# Patient Record
Sex: Female | Born: 1990
Health system: Southern US, Community
[De-identification: ages and names within clinical notes are randomized; demographics above are authoritative.]

## PROBLEM LIST (undated history)

## (undated) DIAGNOSIS — D649 Anemia, unspecified: Secondary | ICD-10-CM

## (undated) DIAGNOSIS — E559 Vitamin D deficiency, unspecified: Secondary | ICD-10-CM

## (undated) DIAGNOSIS — IMO0002 Reserved for concepts with insufficient information to code with codable children: Secondary | ICD-10-CM

## (undated) HISTORY — DX: Reserved for concepts with insufficient information to code with codable children: IMO0002

## (undated) HISTORY — DX: Anemia, unspecified: D64.9

## (undated) HISTORY — PX: WISDOM TOOTH EXTRACTION: SHX21

## (undated) HISTORY — DX: Vitamin D deficiency, unspecified: E55.9

---
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
## 2009-07-31 ENCOUNTER — Emergency Department (HOSPITAL_COMMUNITY): Admission: EM | Admit: 2009-07-31 | Discharge: 2009-08-01 | Payer: Self-pay | Admitting: Emergency Medicine

## 2010-10-02 LAB — COMPREHENSIVE METABOLIC PANEL
ALT: 15 U/L (ref 0–35)
AST: 29 U/L (ref 0–37)
Albumin: 4.1 g/dL (ref 3.5–5.2)
Alkaline Phosphatase: 53 U/L (ref 39–117)
BUN: 9 mg/dL (ref 6–23)
CO2: 27 mEq/L (ref 19–32)
Calcium: 8.9 mg/dL (ref 8.4–10.5)
Chloride: 100 mEq/L (ref 96–112)
Creatinine, Ser: 0.62 mg/dL (ref 0.4–1.2)
GFR calc Af Amer: >60 mL/min (ref >60)
GFR calc non Af Amer: >60 mL/min (ref >60)
Glucose, Bld: 101 mg/dL — ABNORMAL HIGH (ref 70–99)
Potassium: 3.1 mEq/L — ABNORMAL LOW (ref 3.5–5.1)
Sodium: 134 mEq/L — ABNORMAL LOW (ref 135–145)
Total Bilirubin: 0.4 mg/dL (ref 0.3–1.2)
Total Protein: 7.7 g/dL (ref 6.0–8.3)

## 2010-10-02 LAB — CBC
HCT: 38.1 % (ref 36.0–46.0)
Hemoglobin: 13.4 g/dL (ref 12.0–15.0)
MCHC: 35.1 g/dL (ref 30.0–36.0)
MCV: 91.8 fL (ref 78.0–100.0)
Platelets: 197 10*3/uL (ref 150–400)
RBC: 4.15 MIL/uL (ref 3.87–5.11)
RDW: 12.4 % (ref 11.5–15.5)
WBC: 8.3 10*3/uL (ref 4.0–10.5)

## 2010-10-02 LAB — DIFFERENTIAL
Basophils Absolute: 0 10*3/uL (ref 0.0–0.1)
Basophils Relative: 0 % (ref 0–1)
Eosinophils Absolute: 0 10*3/uL (ref 0.0–0.7)
Eosinophils Relative: 1 % (ref 0–5)
Lymphocytes Relative: 16 % (ref 12–46)
Lymphs Abs: 1.4 10*3/uL (ref 0.7–4.0)
Monocytes Absolute: 0.6 10*3/uL (ref 0.1–1.0)
Monocytes Relative: 7 % (ref 3–12)
Neutro Abs: 6.3 10*3/uL (ref 1.7–7.7)
Neutrophils Relative %: 76 % (ref 43–77)

## 2010-10-02 LAB — URINALYSIS, ROUTINE W REFLEX MICROSCOPIC
Bilirubin Urine: NEGATIVE
Glucose, UA: NEGATIVE mg/dL
Hgb urine dipstick: NEGATIVE
Ketones, ur: NEGATIVE mg/dL
Nitrite: NEGATIVE
Protein, ur: NEGATIVE mg/dL
Specific Gravity, Urine: 1.01 (ref 1.005–1.030)
Urobilinogen, UA: 0.2 mg/dL (ref 0.0–1.0)
pH: 7 (ref 5.0–8.0)

## 2010-10-02 LAB — POCT PREGNANCY, URINE: Preg Test, Ur: NEGATIVE

## 2010-10-02 LAB — URINE MICROSCOPIC-ADD ON

## 2010-10-02 LAB — LIPASE, BLOOD: Lipase: 37 U/L (ref 11–59)

## 2011-07-18 LAB — HM PAP SMEAR: HM Pap smear: ABNORMAL

## 2012-10-28 ENCOUNTER — Other Ambulatory Visit: Payer: Self-pay | Admitting: Orthopedic Surgery

## 2012-11-18 ENCOUNTER — Encounter (HOSPITAL_COMMUNITY): Payer: Self-pay

## 2012-11-21 ENCOUNTER — Encounter (HOSPITAL_COMMUNITY)
Admission: RE | Admit: 2012-11-21 | Discharge: 2012-11-21 | Disposition: A | Discharge status: Home / Self Care | Payer: BC Managed Care – PPO | Source: Ambulatory Visit | Attending: Orthopedic Surgery | Admitting: Orthopedic Surgery

## 2012-11-21 ENCOUNTER — Ambulatory Visit (HOSPITAL_COMMUNITY)
Admission: RE | Admit: 2012-11-21 | Discharge: 2012-11-21 | Disposition: A | Discharge status: Home / Self Care | Payer: BC Managed Care – PPO | Source: Ambulatory Visit | Attending: Orthopedic Surgery | Admitting: Orthopedic Surgery

## 2012-11-21 ENCOUNTER — Encounter (HOSPITAL_COMMUNITY): Payer: Self-pay

## 2012-11-21 DIAGNOSIS — Z01818 Encounter for other preprocedural examination: Secondary | ICD-10-CM | POA: Insufficient documentation

## 2012-11-21 DIAGNOSIS — Z01812 Encounter for preprocedural laboratory examination: Secondary | ICD-10-CM | POA: Insufficient documentation

## 2012-11-21 DIAGNOSIS — Z01810 Encounter for preprocedural cardiovascular examination: Secondary | ICD-10-CM | POA: Insufficient documentation

## 2012-11-21 LAB — CBC WITH DIFFERENTIAL/PLATELET
Basophils Absolute: 0 10*3/uL (ref 0.0–0.1)
Basophils Relative: 1 % (ref 0–1)
Eosinophils Absolute: 0.1 10*3/uL (ref 0.0–0.7)
Eosinophils Relative: 2 % (ref 0–5)
HCT: 38.5 % (ref 36.0–46.0)
Hemoglobin: 13.9 g/dL (ref 12.0–15.0)
Lymphocytes Relative: 38 % (ref 12–46)
Lymphs Abs: 2.1 10*3/uL (ref 0.7–4.0)
MCH: 30.6 pg (ref 26.0–34.0)
MCHC: 36.1 g/dL — ABNORMAL HIGH (ref 30.0–36.0)
MCV: 84.8 fL (ref 78.0–100.0)
Monocytes Absolute: 0.4 10*3/uL (ref 0.1–1.0)
Monocytes Relative: 8 % (ref 3–12)
Neutro Abs: 2.9 10*3/uL (ref 1.7–7.7)
Neutrophils Relative %: 53 % (ref 43–77)
Platelets: 259 10*3/uL (ref 150–400)
RBC: 4.54 MIL/uL (ref 3.87–5.11)
RDW: 12.2 % (ref 11.5–15.5)
WBC: 5.5 10*3/uL (ref 4.0–10.5)

## 2012-11-21 LAB — COMPREHENSIVE METABOLIC PANEL
ALT: 11 U/L (ref 0–35)
AST: 31 U/L (ref 0–37)
Albumin: 3.9 g/dL (ref 3.5–5.2)
Alkaline Phosphatase: 53 U/L (ref 39–117)
BUN: 13 mg/dL (ref 6–23)
CO2: 31 mEq/L (ref 19–32)
Calcium: 9.1 mg/dL (ref 8.4–10.5)
Chloride: 100 mEq/L (ref 96–112)
Creatinine, Ser: 0.65 mg/dL (ref 0.50–1.10)
GFR calc Af Amer: >90 mL/min (ref >90)
GFR calc non Af Amer: >90 mL/min (ref >90)
Glucose, Bld: 83 mg/dL (ref 70–99)
Potassium: 3.3 mEq/L — ABNORMAL LOW (ref 3.5–5.1)
Sodium: 139 mEq/L (ref 135–145)
Total Bilirubin: 0.4 mg/dL (ref 0.3–1.2)
Total Protein: 7.5 g/dL (ref 6.0–8.3)

## 2012-11-21 LAB — URINALYSIS, ROUTINE W REFLEX MICROSCOPIC
Bilirubin Urine: NEGATIVE
Glucose, UA: NEGATIVE mg/dL
Hgb urine dipstick: NEGATIVE
Ketones, ur: NEGATIVE mg/dL
Nitrite: NEGATIVE
Protein, ur: NEGATIVE mg/dL
Specific Gravity, Urine: 1.024 (ref 1.005–1.030)
Urobilinogen, UA: 0.2 mg/dL (ref 0.0–1.0)
pH: 6 (ref 5.0–8.0)

## 2012-11-21 LAB — URINE MICROSCOPIC-ADD ON

## 2012-11-21 LAB — PROTIME-INR
INR: 0.96 (ref 0.00–1.49)
Prothrombin Time: 12.7 seconds (ref 11.6–15.2)

## 2012-11-21 LAB — SURGICAL PCR SCREEN
MRSA, PCR: NEGATIVE
Staphylococcus aureus: NEGATIVE

## 2012-11-21 LAB — HCG, SERUM, QUALITATIVE: Preg, Serum: NEGATIVE

## 2012-11-21 LAB — APTT: aPTT: 30 seconds (ref 24–37)

## 2012-11-21 NOTE — Pre-Procedure Instructions (Signed)
Melissa Barnes  11/21/2012   Your procedure is scheduled on:  Nov 27, 2012  Report to [facility] at 6:30 AM.  Call this number if you have problems the morning of surgery: [phone]   Remember:   Do not eat food or drink liquids after midnight.   Take these medicines the morning of surgery with A SIP OF WATER: cyclobenzaprine(flexeril)   Do not wear jewelry, make-up or nail polish.  Do not wear lotions, powders, or perfumes. You may wear deodorant.  Do not shave 48 hours prior to surgery. Men may shave face and neck.  Do not bring valuables to the hospital.  Contacts, dentures or bridgework may not be worn into surgery.  Leave suitcase in the car. After surgery it may be brought to your room.  For patients admitted to the hospital, checkout time is 11:00 AM the day of  discharge.   Patients discharged the day of surgery will not be allowed to drive  home.  Name and phone number of your driver:   Special Instructions: Shower using CHG 2 nights before surgery and the night before surgery.  If you shower the day of surgery use CHG.  Use special wash - you have one bottle of CHG for all showers.  You should use approximately 1/3 of the bottle for each shower.   Please read over the following fact sheets that you were given: Pain Booklet, Coughing and Deep Breathing, Blood Transfusion Information and Surgical Site Infection Prevention

## 2012-11-21 NOTE — Progress Notes (Signed)
T&S DOS PT REFUSED TO WEAR ARMBAND

## 2012-11-26 MED ORDER — CEFAZOLIN SODIUM-DEXTROSE 2-3 GM-% IV SOLR
2.0000 g | INTRAVENOUS | Status: AC
  Administered 2012-11-27: 2 g via INTRAVENOUS
  Filled 2012-11-26: qty 50

## 2012-11-27 ENCOUNTER — Ambulatory Visit (HOSPITAL_COMMUNITY): Payer: BC Managed Care – PPO

## 2012-11-27 ENCOUNTER — Encounter (HOSPITAL_COMMUNITY): Payer: Self-pay | Admitting: Anesthesiology

## 2012-11-27 ENCOUNTER — Ambulatory Visit (HOSPITAL_COMMUNITY): Payer: BC Managed Care – PPO | Admitting: Anesthesiology

## 2012-11-27 ENCOUNTER — Ambulatory Visit (HOSPITAL_COMMUNITY)
Admission: RE | Admit: 2012-11-27 | Discharge: 2012-11-27 | Disposition: A | Discharge status: Home / Self Care | Payer: BC Managed Care – PPO | Source: Ambulatory Visit | Attending: Orthopedic Surgery | Admitting: Orthopedic Surgery

## 2012-11-27 ENCOUNTER — Encounter (HOSPITAL_COMMUNITY): Payer: Self-pay | Admitting: *Deleted

## 2012-11-27 ENCOUNTER — Encounter (HOSPITAL_COMMUNITY)
Admission: RE | Disposition: A | Discharge status: Home / Self Care | Payer: Self-pay | Source: Ambulatory Visit | Attending: Orthopedic Surgery

## 2012-11-27 DIAGNOSIS — M5126 Other intervertebral disc displacement, lumbar region: Secondary | ICD-10-CM | POA: Insufficient documentation

## 2012-11-27 HISTORY — PX: LUMBAR LAMINECTOMY/DECOMPRESSION MICRODISCECTOMY: SHX5026

## 2012-11-27 LAB — TYPE AND SCREEN
ABO/RH(D): B POS
Antibody Screen: NEGATIVE

## 2012-11-27 LAB — ABO/RH: ABO/RH(D): B POS

## 2012-11-27 SURGERY — LUMBAR LAMINECTOMY/DECOMPRESSION MICRODISCECTOMY
Anesthesia: General | Site: Spine Lumbar | Laterality: Right | Wound class: Clean

## 2012-11-27 MED ORDER — NEOSTIGMINE METHYLSULFATE 1 MG/ML IJ SOLN
INTRAMUSCULAR | Status: DC | PRN
  Administered 2012-11-27: 3 mg via INTRAVENOUS

## 2012-11-27 MED ORDER — FENTANYL CITRATE 0.05 MG/ML IJ SOLN
INTRAMUSCULAR | Status: DC | PRN
  Administered 2012-11-27 (×2): 50 ug via INTRAVENOUS
  Administered 2012-11-27: 150 ug via INTRAVENOUS

## 2012-11-27 MED ORDER — ACETAMINOPHEN 10 MG/ML IV SOLN: 1000.0000 mg | Freq: Once | INTRAVENOUS | Status: AC | PRN

## 2012-11-27 MED ORDER — ACETAMINOPHEN 10 MG/ML IV SOLN
INTRAVENOUS | Status: AC
  Administered 2012-11-27: 1000 mg via INTRAVENOUS
  Filled 2012-11-27: qty 100

## 2012-11-27 MED ORDER — LIDOCAINE HCL 4 % MT SOLN
OROMUCOSAL | Status: DC | PRN
  Administered 2012-11-27: 4 mL via TOPICAL

## 2012-11-27 MED ORDER — DEXAMETHASONE SODIUM PHOSPHATE 4 MG/ML IJ SOLN
INTRAMUSCULAR | Status: DC | PRN
  Administered 2012-11-27: 4 mg via INTRAVENOUS

## 2012-11-27 MED ORDER — METHYLPREDNISOLONE ACETATE 40 MG/ML IJ SUSP
INTRAMUSCULAR | Status: AC
  Filled 2012-11-27: qty 1

## 2012-11-27 MED ORDER — ARTIFICIAL TEARS OP OINT
TOPICAL_OINTMENT | OPHTHALMIC | Status: DC | PRN
  Administered 2012-11-27: 1 via OPHTHALMIC

## 2012-11-27 MED ORDER — BUPIVACAINE-EPINEPHRINE 0.25% -1:200000 IJ SOLN
INTRAMUSCULAR | Status: DC | PRN
  Administered 2012-11-27: 26 mL

## 2012-11-27 MED ORDER — BUPIVACAINE-EPINEPHRINE PF 0.25-1:200000 % IJ SOLN
INTRAMUSCULAR | Status: AC
  Filled 2012-11-27: qty 30

## 2012-11-27 MED ORDER — LACTATED RINGERS IV SOLN
INTRAVENOUS | Status: DC | PRN
  Administered 2012-11-27 (×2): via INTRAVENOUS

## 2012-11-27 MED ORDER — LIDOCAINE HCL (CARDIAC) 20 MG/ML IV SOLN
INTRAVENOUS | Status: DC | PRN
  Administered 2012-11-27: 50 mg via INTRAVENOUS

## 2012-11-27 MED ORDER — HYDROMORPHONE HCL PF 1 MG/ML IJ SOLN
INTRAMUSCULAR | Status: DC | PRN
  Administered 2012-11-27: 1 mg via INTRAVENOUS

## 2012-11-27 MED ORDER — ROCURONIUM BROMIDE 100 MG/10ML IV SOLN
INTRAVENOUS | Status: DC | PRN
  Administered 2012-11-27: 50 mg via INTRAVENOUS

## 2012-11-27 MED ORDER — METHYLENE BLUE 1 % INJ SOLN
INTRAMUSCULAR | Status: AC
  Filled 2012-11-27: qty 10

## 2012-11-27 MED ORDER — POVIDONE-IODINE 7.5 % EX SOLN: Freq: Once | CUTANEOUS | Status: DC

## 2012-11-27 MED ORDER — ONDANSETRON HCL 4 MG/2ML IJ SOLN
4.0000 mg | Freq: Once | INTRAMUSCULAR | Status: AC | PRN
  Administered 2012-11-27: 4 mg via INTRAVENOUS

## 2012-11-27 MED ORDER — HYDROMORPHONE HCL PF 1 MG/ML IJ SOLN
INTRAMUSCULAR | Status: AC
  Filled 2012-11-27: qty 1

## 2012-11-27 MED ORDER — ONDANSETRON HCL 4 MG/2ML IJ SOLN
INTRAMUSCULAR | Status: AC
  Filled 2012-11-27: qty 2

## 2012-11-27 MED ORDER — GLYCOPYRROLATE 0.2 MG/ML IJ SOLN
INTRAMUSCULAR | Status: DC | PRN
  Administered 2012-11-27: 0.4 mg via INTRAVENOUS

## 2012-11-27 MED ORDER — HYDROMORPHONE HCL PF 1 MG/ML IJ SOLN
0.2500 mg | INTRAMUSCULAR | Status: DC | PRN
  Administered 2012-11-27: 0.5 mg via INTRAVENOUS

## 2012-11-27 MED ORDER — PHENYLEPHRINE HCL 10 MG/ML IJ SOLN
INTRAMUSCULAR | Status: DC | PRN
Start: 2012-11-27 — End: 2012-11-27
  Administered 2012-11-27: 40 ug via INTRAVENOUS

## 2012-11-27 MED ORDER — THROMBIN 20000 UNITS EX SOLR
CUTANEOUS | Status: AC
  Filled 2012-11-27: qty 20000

## 2012-11-27 MED ORDER — MIDAZOLAM HCL 5 MG/5ML IJ SOLN
INTRAMUSCULAR | Status: DC | PRN
  Administered 2012-11-27: 2 mg via INTRAVENOUS

## 2012-11-27 MED ORDER — HEMOSTATIC AGENTS (NO CHARGE) OPTIME
TOPICAL | Status: DC | PRN
  Administered 2012-11-27: 1 via TOPICAL

## 2012-11-27 MED ORDER — INDIGOTINDISULFONATE SODIUM 8 MG/ML IJ SOLN
INTRAMUSCULAR | Status: AC
  Filled 2012-11-27: qty 5

## 2012-11-27 MED ORDER — THROMBIN 20000 UNITS EX SOLR
CUTANEOUS | Status: DC | PRN
  Administered 2012-11-27: 10:00:00 via TOPICAL

## 2012-11-27 MED ORDER — PROPOFOL 10 MG/ML IV BOLUS
INTRAVENOUS | Status: DC | PRN
Start: 2012-11-27 — End: 2012-11-27
  Administered 2012-11-27: 200 mg via INTRAVENOUS

## 2012-11-27 MED ORDER — ONDANSETRON HCL 4 MG/2ML IJ SOLN
INTRAMUSCULAR | Status: DC | PRN
  Administered 2012-11-27: 4 mg via INTRAVENOUS

## 2012-11-27 MED ORDER — METHYLPREDNISOLONE ACETATE 40 MG/ML IJ SUSP
INTRAMUSCULAR | Status: DC | PRN
  Administered 2012-11-27: 40 mg via INTRA_ARTICULAR

## 2012-11-27 MED ORDER — INDIGOTINDISULFONATE SODIUM 8 MG/ML IJ SOLN
INTRAMUSCULAR | Status: DC | PRN
  Administered 2012-11-27: 1 mL

## 2012-11-27 SURGICAL SUPPLY — 64 items
BENZOIN TINCTURE PRP APPL 2/3 (GAUZE/BANDAGES/DRESSINGS) ×2 IMPLANT
BUR ROUND PRECISION 4.0 (BURR) ×2 IMPLANT
CANISTER SUCTION 2500CC (MISCELLANEOUS) ×2 IMPLANT
CLOTH BEACON ORANGE TIMEOUT ST (SAFETY) ×2 IMPLANT
CLSR STERI-STRIP ANTIMIC 1/2X4 (GAUZE/BANDAGES/DRESSINGS) ×2 IMPLANT
CORDS BIPOLAR (ELECTRODE) ×2 IMPLANT
COVER SURGICAL LIGHT HANDLE (MISCELLANEOUS) ×2 IMPLANT
DRAIN CHANNEL 15F RND FF W/TCR (WOUND CARE) IMPLANT
DRAPE POUCH INSTRU U-SHP 10X18 (DRAPES) ×4 IMPLANT
DRAPE SURG 17X23 STRL (DRAPES) ×8 IMPLANT
DURAPREP 26ML APPLICATOR (WOUND CARE) ×2 IMPLANT
ELECT BLADE 4.0 EZ CLEAN MEGAD (MISCELLANEOUS)
ELECT CAUTERY BLADE 6.4 (BLADE) ×2 IMPLANT
ELECT REM PT RETURN 9FT ADLT (ELECTROSURGICAL) ×2
ELECTRODE BLDE 4.0 EZ CLN MEGD (MISCELLANEOUS) IMPLANT
ELECTRODE REM PT RTRN 9FT ADLT (ELECTROSURGICAL) ×1 IMPLANT
EVACUATOR SILICONE 100CC (DRAIN) IMPLANT
FILTER STRAW FLUID ASPIR (MISCELLANEOUS) ×2 IMPLANT
GAUZE SPONGE 4X4 16PLY XRAY LF (GAUZE/BANDAGES/DRESSINGS) ×4 IMPLANT
GLOVE BIO SURGEON STRL SZ7 (GLOVE) ×2 IMPLANT
GLOVE BIO SURGEON STRL SZ8 (GLOVE) ×2 IMPLANT
GLOVE BIOGEL PI IND STRL 7.0 (GLOVE) ×1 IMPLANT
GLOVE BIOGEL PI IND STRL 8 (GLOVE) ×1 IMPLANT
GLOVE BIOGEL PI INDICATOR 7.0 (GLOVE) ×1
GLOVE BIOGEL PI INDICATOR 8 (GLOVE) ×1
GOWN PREVENTION PLUS LG XLONG (DISPOSABLE) ×2 IMPLANT
GOWN STRL NON-REIN LRG LVL3 (GOWN DISPOSABLE) ×4 IMPLANT
IV CATH 14GX2 1/4 (CATHETERS) ×2 IMPLANT
KIT BASIN OR (CUSTOM PROCEDURE TRAY) ×2 IMPLANT
KIT ROOM TURNOVER OR (KITS) ×2 IMPLANT
NEEDLE 18GX1X1/2 (RX/OR ONLY) (NEEDLE) ×2 IMPLANT
NEEDLE 22X1 1/2 (OR ONLY) (NEEDLE) IMPLANT
NEEDLE HYPO 25GX1X1/2 BEV (NEEDLE) ×2 IMPLANT
NEEDLE SPNL 18GX3.5 QUINCKE PK (NEEDLE) ×4 IMPLANT
NS IRRIG 1000ML POUR BTL (IV SOLUTION) ×2 IMPLANT
PACK LAMINECTOMY ORTHO (CUSTOM PROCEDURE TRAY) ×2 IMPLANT
PACK UNIVERSAL I (CUSTOM PROCEDURE TRAY) ×2 IMPLANT
PAD ARMBOARD 7.5X6 YLW CONV (MISCELLANEOUS) ×4 IMPLANT
PATTIES SURGICAL .5 X.5 (GAUZE/BANDAGES/DRESSINGS) IMPLANT
PATTIES SURGICAL .5 X1 (DISPOSABLE) ×2 IMPLANT
SPONGE GAUZE 4X4 12PLY (GAUZE/BANDAGES/DRESSINGS) ×2 IMPLANT
SPONGE INTESTINAL PEANUT (DISPOSABLE) ×2 IMPLANT
SPONGE SURGIFOAM ABS GEL 100 (HEMOSTASIS) ×2 IMPLANT
STRIP CLOSURE SKIN 1/2X4 (GAUZE/BANDAGES/DRESSINGS) IMPLANT
SURGIFLO TRUKIT (HEMOSTASIS) ×2 IMPLANT
SUT MNCRL AB 4-0 PS2 18 (SUTURE) ×2 IMPLANT
SUT VIC AB 0 CT1 18XCR BRD 8 (SUTURE) IMPLANT
SUT VIC AB 0 CT1 27 (SUTURE)
SUT VIC AB 0 CT1 27XBRD ANBCTR (SUTURE) IMPLANT
SUT VIC AB 0 CT1 8-18 (SUTURE)
SUT VIC AB 1 CT1 18XCR BRD 8 (SUTURE) ×1 IMPLANT
SUT VIC AB 1 CT1 8-18 (SUTURE) ×1
SUT VIC AB 2-0 CT2 18 VCP726D (SUTURE) ×2 IMPLANT
SYR 20CC LL (SYRINGE) IMPLANT
SYR 50ML SLIP (SYRINGE) ×2 IMPLANT
SYR BULB IRRIGATION 50ML (SYRINGE) ×2 IMPLANT
SYR CONTROL 10ML LL (SYRINGE) ×4 IMPLANT
SYR TB 1ML 26GX3/8 SAFETY (SYRINGE) ×4 IMPLANT
SYR TB 1ML LUER SLIP (SYRINGE) ×4 IMPLANT
TAPE CLOTH SURG 4X10 WHT LF (GAUZE/BANDAGES/DRESSINGS) ×2 IMPLANT
TOWEL OR 17X24 6PK STRL BLUE (TOWEL DISPOSABLE) ×2 IMPLANT
TOWEL OR 17X26 10 PK STRL BLUE (TOWEL DISPOSABLE) ×2 IMPLANT
WATER STERILE IRR 1000ML POUR (IV SOLUTION) ×2 IMPLANT
YANKAUER SUCT BULB TIP NO VENT (SUCTIONS) ×2 IMPLANT

## 2012-11-27 NOTE — Transfer of Care (Signed)
Immediate Anesthesia Transfer of Care Note  Patient: Melissa Barnes  Procedure(s) Performed: Procedure(s) with comments: LUMBAR LAMINECTOMY/DECOMPRESSION MICRODISCECTOMY (Right) - Right-sided lumbar 5-sacral 1 microdisectomy  Patient Location: PACU  Anesthesia Type:General  Level of Consciousness: awake, alert  and oriented  Airway & Oxygen Therapy: Patient Spontanous Breathing and Patient connected to nasal cannula oxygen  Post-op Assessment: Report given to PACU RN, Post -op Vital signs reviewed and stable and Patient moving all extremities X 4  Post vital signs: Reviewed and stable  Complications: No apparent anesthesia complications

## 2012-11-27 NOTE — Anesthesia Postprocedure Evaluation (Signed)
  Anesthesia Post-op Note  Patient: Melissa Barnes  Procedure(s) Performed: Procedure(s) with comments: LUMBAR LAMINECTOMY/DECOMPRESSION MICRODISCECTOMY (Right) - Right-sided lumbar 5-sacral 1 microdisectomy  Patient Location: Short Stay  Anesthesia Type:General  Level of Consciousness: awake, alert  and oriented  Airway and Oxygen Therapy: Patient Spontanous Breathing  Post-op Pain: mild  Post-op Assessment: Post-op Vital signs reviewed, Patient's Cardiovascular Status Stable, Respiratory Function Stable, Patent Airway and Pain level controlled  Post-op Vital Signs: stable   Complications: See note

## 2012-11-27 NOTE — Anesthesia Procedure Notes (Signed)
Procedure Name: Intubation Date/Time: 11/27/2012 8:44 AM Performed by: Orvilla Fus A Pre-anesthesia Checklist: Patient identified, Timeout performed, Emergency Drugs available, Suction available and Patient being monitored Patient Re-evaluated:Patient Re-evaluated prior to inductionOxygen Delivery Method: Circle system utilized Preoxygenation: Pre-oxygenation with 100% oxygen Intubation Type: IV induction Ventilation: Mask ventilation without difficulty Laryngoscope Size: Mac and 3 Grade View: Grade I Tube type: Oral Tube size: 7.5 mm Number of attempts: 1 Airway Equipment and Method: Stylet and LTA kit utilized Placement Confirmation: ETT inserted through vocal cords under direct vision,  breath sounds checked- equal and bilateral and positive ETCO2 Secured at: 21 cm Tube secured with: Tape Dental Injury: Teeth and Oropharynx as per pre-operative assessment

## 2012-11-27 NOTE — H&P (Signed)
PREOPERATIVE H&P  Chief Complaint: + SLR on right  HPI: Melissa Barnes is a 22 y.o. female who presents with ongoing right leg pain x 1 year. Patient failed multiple forms of conservative care.  History reviewed. No pertinent past medical history. History reviewed. No pertinent past surgical history. History   Social History  . Marital Status: Single    Spouse Name: N/A    Number of Children: N/A  . Years of Education: N/A   Social History Main Topics  . Smoking status: Never Smoker   . Smokeless tobacco: None  . Alcohol Use: Yes     Comment: occassional  . Drug Use: No  . Sexually Active: None   Other Topics Concern  . None   Social History Narrative  . None   History reviewed. No pertinent family history. Allergies  Allergen Reactions  . Minocycline Hives  . Penicillins Hives   Prior to Admission medications   Medication Sig Start Date End Date Taking? Authorizing Provider  B Complex Vitamins (B COMPLEX PO) Take 2 tablets by mouth daily.   Yes Historical Provider, MD  Cholecalciferol (VITAMIN D PO) Take 1 tablet by mouth daily.   Yes Historical Provider, MD  cyclobenzaprine (FLEXERIL) 10 MG tablet Take 10 mg by mouth at bedtime as needed (sleep).   Yes Historical Provider, MD  MAGNESIUM PO Take 2 tablets by mouth daily.   Yes Historical Provider, MD     All other systems have been reviewed and were otherwise negative with the exception of those mentioned in the HPI and as above.  Physical Exam: Filed Vitals:   11/27/12 0709  BP: 126/82  Pulse: 84  Temp: 98.4 F (36.9 C)  Resp: 20    General: Alert, no acute distress Cardiovascular: No pedal edema Respiratory: No cyanosis, no use of accessory musculature GI: No organomegaly, abdomen is soft and non-tender Skin: No lesions in the area of chief complaint Neurologic: Sensation intact distally Psychiatric: Patient is competent for consent with normal mood and affect Lymphatic: No axillary or cervical  lymphadenopathy  MUSCULOSKELETAL: + SLR on right  Assessment/Plan: right leg pain Plan for Procedure(s): LUMBAR LAMINECTOMY/DECOMPRESSION MICRODISCECTOMY L5/S1   Emilee Hero, MD 11/27/2012 8:28 AM

## 2012-11-27 NOTE — Preoperative (Signed)
Beta Blockers   Reason not to administer Beta Blockers:Not Applicable 

## 2012-11-27 NOTE — Progress Notes (Signed)
22 year old female underwent uneventful L5-S1 laminectomy with decompression. Now in Short Stay, complaining of numbness in distal left 3rd and 4th fingers. No motor deficits.   Impression: Possible post-op sensory neuropathy. This should resolve over the next several days. If symptoms persist, she can contact me and I will arrange for further evaluation. Questions answered.  Kipp Brood, MD

## 2012-11-27 NOTE — Anesthesia Preprocedure Evaluation (Addendum)
Anesthesia Evaluation  Patient identified by MRN, date of birth, ID band Patient awake    Reviewed: Allergy & Precautions, H&P , NPO status , Patient's Chart, lab work & pertinent test results  History of Anesthesia Complications Negative for: history of anesthetic complications (first anesthestic- no family hx of anesthesia)  Airway Mallampati: I TM Distance: >3 FB Neck ROM: Full    Dental  (+) Teeth Intact and Dental Advisory Given   Pulmonary neg pulmonary ROS,  breath sounds clear to auscultation        Cardiovascular Exercise Tolerance: Good Rhythm:Regular Rate:Normal     Neuro/Psych Right gluteal and lower extremity pain negative psych ROS   GI/Hepatic negative GI ROS, Neg liver ROS,   Endo/Other  negative endocrine ROS  Renal/GU negative Renal ROS     Musculoskeletal negative musculoskeletal ROS (+)   Abdominal   Peds negative pediatric ROS (+)  Hematology negative hematology ROS (+)   Anesthesia Other Findings   Reproductive/Obstetrics negative OB ROS                         Anesthesia Physical Anesthesia Plan  ASA: I  Anesthesia Plan: General   Post-op Pain Management:    Induction: Intravenous  Airway Management Planned: Oral ETT  Additional Equipment:   Intra-op Plan:   Post-operative Plan: Extubation in OR  Informed Consent: I have reviewed the patients History and Physical, chart, labs and discussed the procedure including the risks, benefits and alternatives for the proposed anesthesia with the patient or authorized representative who has indicated his/her understanding and acceptance.   Dental advisory given  Plan Discussed with: CRNA, Surgeon and Anesthesiologist  Anesthesia Plan Comments: (HNP L5-S1  Plan GA with oral ETT  Kipp Brood, MD)        Anesthesia Quick Evaluation

## 2012-11-27 NOTE — Progress Notes (Addendum)
Dr. Noreene Larsson came by to speak with patient re: numbness to left first- middle finger.  Dr. Noreene Larsson to f/u with patient. (also spoke with Josh in OR room with Dr. Edrick Oh who stated they have asked Dr. Noreene Larsson to see pt.

## 2012-11-28 NOTE — Op Note (Signed)
NAMEMarland Kitchen  Melissa Barnes, Melissa Barnes                ACCOUNT NO.:  [id number]  MEDICAL RECORD NO.:  [id number]  LOCATION:  [facility]                         FACILITY:  [facility]  PHYSICIAN:  Estill Bamberg, MD      DATE OF BIRTH:  Jul 04, 1991  DATE OF PROCEDURE:  11/27/2012 DATE OF DISCHARGE:  11/27/2012                              OPERATIVE REPORT   PREOPERATIVE DIAGNOSIS: 1. Large right-sided L5-S1 disk herniation. 2. Right-sided S1 radiculopathy.  POSTOPERATIVE DIAGNOSIS: 1. Large right-sided L5-S1 disk herniation. 2. Right-sided S1 radiculopathy.  PROCEDURE:  Right-sided L5-S1 microdiskectomy.  SURGEON:  Estill Bamberg, MD  ASSISTANTS:  Jason Coop, PA-C  ANESTHESIA:  General endotracheal anesthesia.  COMPLICATIONS:  None.  DISPOSITION:  Stable.  ESTIMATED BLOOD LOSS:  Minimal.  INDICATIONS FOR PROCEDURE:  Briefly, Melissa Barnes is a very pleasant 22 year old female, who did present to me with severe pain in her right leg. The pain had been present on and off for about a year, however, it was increasing in intensity's.  An MRI did reveal a very large right-sided paracentral disk herniation, clearly causing compression of the right S1 nerve.  This did correlate to her symptoms.  The patient did have multiple forms of conservative care, but did continue to have pain. Given her ongoing symptoms and significant pain, we did have a discussion regarding going forward with the right-sided L5-S1 microdiskectomy procedure.  The patient fully understood the risks and limitations of the procedure as outlined in my preoperative note.  OPERATIVE DETAILS:  On Nov 27, 2012, the patient was brought to surgery and general endotracheal anesthesia was administered.  The patient was placed prone on a well-padded flat Jackson bed with spinal frame. Antibiotics were given and a time-out procedure was performed.  The back was prepped and draped in the usual sterile fashion.  I made a 1 inch incision centered over  the L5-S1 intervertebral space.  The fascia was incised in a curvilinear fashion.  The lamina of L5 and S1 were subperiosteally exposed.  A lateral intraoperative radiograph did confirm the appropriate operative level.  I then removed the ligamentum flavum and I did perform a partial facetectomy.  At this point, I was able to identify the traversing S1 nerve, which was clearly noted to be under significant pressure.  I was able to gently and safely mobilize the nerve medially.  A very large herniated disk fragment was readily apparent.  I did use a micro nerve hook to tease away the superficial layers overlying the disk fragment.  A very large disk herniation did readily clear itself.  The herniation was removed in 2 very large fragments.  Of note, the herniation did result in a significant annular defect at the posterolateral aspect of the disk.  I did proceed with removing additional disk fragments adherent to the vertebral bodies of L5 and of S1, it was to ensure adequate and complete decompression of the traversing S1 nerve.  I was able to remove all the disk fragments causing compression of the nerve.  Again, upon exploration of the epidural space, it was notable that there was a annular defect posteriorly as a result of the herniated fragment.  I did make  every effort to minimize any additional disruption of the anulus.  I then flushed the intervertebral space with 30 mL normal saline, in order can ensure there was no additional disk fragments needing removal.  At this portion of the procedure, I did irrigate the wound copiously.  I did control all epidural bleeding using bipolar electrocautery in addition Surgiflo.  A 40 mg of Depo-Medrol was infiltrated about the epidural space.  The fascia was then closed using 0 Vicryl.  The subcutaneous layer was closed using 0 Vicryl, followed by 2-0 Vicryl.  The skin was closed using 3-0 Monocryl.  Benzoin and Steri-Strips were  applied followed by sterile dressing.  All instrument counts were correct at the termination of the procedure.  Of note, Jason Coop was my assistant throughout the entirety of the procedure and aided in essential retraction and suctioning needed throughout the surgery.     Estill Bamberg, MD     MD/MEDQ  D:  11/27/2012  T:  11/28/2012  Job:  [id number]  cc:   Consuella Lose, MD

## 2012-11-29 ENCOUNTER — Encounter (HOSPITAL_COMMUNITY): Payer: Self-pay | Admitting: Orthopedic Surgery

## 2013-06-02 ENCOUNTER — Encounter: Payer: Self-pay | Admitting: Internal Medicine

## 2013-06-02 DIAGNOSIS — IMO0002 Reserved for concepts with insufficient information to code with codable children: Secondary | ICD-10-CM

## 2013-06-02 DIAGNOSIS — E538 Deficiency of other specified B group vitamins: Secondary | ICD-10-CM

## 2013-06-02 DIAGNOSIS — E559 Vitamin D deficiency, unspecified: Secondary | ICD-10-CM | POA: Insufficient documentation

## 2013-06-03 ENCOUNTER — Ambulatory Visit: Payer: BC Managed Care – PPO | Admitting: Physician Assistant

## 2013-06-03 ENCOUNTER — Ambulatory Visit: Payer: Self-pay | Admitting: Emergency Medicine

## 2014-03-18 ENCOUNTER — Encounter: Payer: Self-pay | Admitting: Internal Medicine

## 2014-03-18 ENCOUNTER — Encounter: Payer: Self-pay | Admitting: Physician Assistant

## 2017-10-03 MED FILL — PREVIFEM 0.25-35 MG-MCG TAB: 0.25-35 | 84 days supply | Qty: 84 | Fill #0

## 2017-10-08 DIAGNOSIS — Z01419 Encounter for gynecological examination (general) (routine) without abnormal findings: Secondary | ICD-10-CM | POA: Diagnosis not present

## 2017-10-08 DIAGNOSIS — Z6831 Body mass index (BMI) 31.0-31.9, adult: Secondary | ICD-10-CM | POA: Diagnosis not present

## 2017-12-31 MED FILL — FEMYNOR 0.25-35 MG-MCG TABS: 0.25-35 | 84 days supply | Qty: 84 | Fill #0

## 2018-02-14 ENCOUNTER — Other Ambulatory Visit: Payer: Self-pay

## 2018-02-14 ENCOUNTER — Emergency Department (HOSPITAL_BASED_OUTPATIENT_CLINIC_OR_DEPARTMENT_OTHER): Payer: 59

## 2018-02-14 ENCOUNTER — Emergency Department (HOSPITAL_BASED_OUTPATIENT_CLINIC_OR_DEPARTMENT_OTHER)
Admission: EM | Admit: 2018-02-14 | Discharge: 2018-02-14 | Disposition: A | Discharge status: Home / Self Care | Payer: 59 | Attending: Emergency Medicine | Admitting: Emergency Medicine

## 2018-02-14 ENCOUNTER — Encounter (HOSPITAL_BASED_OUTPATIENT_CLINIC_OR_DEPARTMENT_OTHER): Payer: Self-pay

## 2018-02-14 DIAGNOSIS — R1011 Right upper quadrant pain: Secondary | ICD-10-CM | POA: Diagnosis not present

## 2018-02-14 DIAGNOSIS — Z79899 Other long term (current) drug therapy: Secondary | ICD-10-CM | POA: Insufficient documentation

## 2018-02-14 DIAGNOSIS — R300 Dysuria: Secondary | ICD-10-CM | POA: Diagnosis not present

## 2018-02-14 DIAGNOSIS — K8050 Calculus of bile duct without cholangitis or cholecystitis without obstruction: Secondary | ICD-10-CM

## 2018-02-14 DIAGNOSIS — B349 Viral infection, unspecified: Secondary | ICD-10-CM | POA: Diagnosis not present

## 2018-02-14 DIAGNOSIS — R109 Unspecified abdominal pain: Secondary | ICD-10-CM | POA: Diagnosis present

## 2018-02-14 DIAGNOSIS — R1013 Epigastric pain: Secondary | ICD-10-CM | POA: Diagnosis not present

## 2018-02-14 DIAGNOSIS — R110 Nausea: Secondary | ICD-10-CM | POA: Diagnosis not present

## 2018-02-14 DIAGNOSIS — K828 Other specified diseases of gallbladder: Secondary | ICD-10-CM | POA: Diagnosis not present

## 2018-02-14 DIAGNOSIS — K839 Disease of biliary tract, unspecified: Secondary | ICD-10-CM | POA: Diagnosis not present

## 2018-02-14 LAB — LIPASE, BLOOD: Lipase: 46 U/L (ref 11–51)

## 2018-02-14 LAB — URINALYSIS, ROUTINE W REFLEX MICROSCOPIC
Bilirubin Urine: NEGATIVE
Glucose, UA: NEGATIVE mg/dL
Hgb urine dipstick: NEGATIVE
Ketones, ur: NEGATIVE mg/dL
Leukocytes, UA: NEGATIVE
Nitrite: NEGATIVE
Protein, ur: NEGATIVE mg/dL
Specific Gravity, Urine: 1.02 (ref 1.005–1.030)
pH: 6.5 (ref 5.0–8.0)

## 2018-02-14 LAB — COMPREHENSIVE METABOLIC PANEL
ALT: 27 U/L (ref 0–44)
AST: 33 U/L (ref 15–41)
Albumin: 4 g/dL (ref 3.5–5.0)
Alkaline Phosphatase: 52 U/L (ref 38–126)
Anion gap: 9 (ref 5–15)
BUN: 13 mg/dL (ref 6–20)
CO2: 27 mmol/L (ref 22–32)
Calcium: 8.5 mg/dL — ABNORMAL LOW (ref 8.9–10.3)
Chloride: 100 mmol/L (ref 98–111)
Creatinine, Ser: 0.63 mg/dL (ref 0.44–1.00)
GFR calc Af Amer: >60 mL/min (ref >60)
GFR calc non Af Amer: >60 mL/min (ref >60)
Glucose, Bld: 104 mg/dL — ABNORMAL HIGH (ref 70–99)
Potassium: 3.4 mmol/L — ABNORMAL LOW (ref 3.5–5.1)
Sodium: 136 mmol/L (ref 135–145)
Total Bilirubin: 0.5 mg/dL (ref 0.3–1.2)
Total Protein: 7.5 g/dL (ref 6.5–8.1)

## 2018-02-14 LAB — CBC WITH DIFFERENTIAL/PLATELET
Basophils Absolute: 0 10*3/uL (ref 0.0–0.1)
Basophils Relative: 0 %
Eosinophils Absolute: 0 10*3/uL (ref 0.0–0.7)
Eosinophils Relative: 0 %
HCT: 39.1 % (ref 36.0–46.0)
Hemoglobin: 13.6 g/dL (ref 12.0–15.0)
Lymphocytes Relative: 9 %
Lymphs Abs: 1.3 10*3/uL (ref 0.7–4.0)
MCH: 30.3 pg (ref 26.0–34.0)
MCHC: 34.8 g/dL (ref 30.0–36.0)
MCV: 87.1 fL (ref 78.0–100.0)
Monocytes Absolute: 0.6 10*3/uL (ref 0.1–1.0)
Monocytes Relative: 4 %
Neutro Abs: 12.4 10*3/uL — ABNORMAL HIGH (ref 1.7–7.7)
Neutrophils Relative %: 87 %
Platelets: 291 10*3/uL (ref 150–400)
RBC: 4.49 MIL/uL (ref 3.87–5.11)
RDW: 12.6 % (ref 11.5–15.5)
WBC: 14.4 10*3/uL — ABNORMAL HIGH (ref 4.0–10.5)

## 2018-02-14 LAB — PREGNANCY, URINE: Preg Test, Ur: NEGATIVE

## 2018-02-14 MED ORDER — TRAMADOL HCL 50 MG PO TABS
100.0000 mg | ORAL_TABLET | Freq: Once | ORAL | Status: AC
  Administered 2018-02-14: 100 mg via ORAL
  Filled 2018-02-14: qty 2

## 2018-02-14 MED ORDER — ONDANSETRON 8 MG PO TBDP: 8.0000 mg | ORAL_TABLET | Freq: Three times a day (TID) | ORAL | 0 refills | Status: DC | PRN

## 2018-02-14 MED ORDER — TRAMADOL HCL 50 MG PO TABS: 50.0000 mg | ORAL_TABLET | Freq: Four times a day (QID) | ORAL | 0 refills | Status: DC | PRN

## 2018-02-14 NOTE — ED Triage Notes (Signed)
Pt c/o intermittent abd pain, diarrhea started 7/27-sore throat x today-states she feels she was bit by insect left wrist 7/16-[facility]neg strep and neg mono neg UA-pt NAD-steady gait

## 2018-02-14 NOTE — ED Provider Notes (Signed)
[facility] EMERGENCY DEPARTMENT Provider Note   CSN: [id number] Arrival date & time: 02/14/18  1745     History   Chief Complaint Chief Complaint  Patient presents with  . Abdominal Pain    HPI Melissa Barnes is a 27 y.o. female.  HPI Melissa Barnes is a 27 y.o. female presents to ED with complaint of abdominal pain and diarrhea for the last two weeks.  Patient states she has had intermittent diarrhea that was associated with epigastric and right upper quadrant pain.  Pain would come and go, sometimes after eating.  She has had some nausea, but no vomiting.  She denies any fever or chills.  Today her pain became significantly worse.  Pain radiating from her right upper quadrant into the back.  Pain is sharp.  She denies any urinary symptoms.  No vaginal discharge or bleeding.  She denies anything making pain better or worse.  She has not taken any medications.  She is not pregnant.  She has no other complaints.  She has never had any abdominal surgeries.  She had a normal bowel movement today.  Past Medical History:  Diagnosis Date  . Anemia   . DDD (degenerative disc disease)   . Unspecified vitamin D deficiency     Patient Active Problem List   Diagnosis Date Noted  . Unspecified vitamin D deficiency 06/02/2013  . DDD (degenerative disc disease) 06/02/2013  . Other B-complex deficiencies 06/02/2013    Past Surgical History:  Procedure Laterality Date  . CYST REMOVAL TRUNK Right 2009   chest wall  . LUMBAR LAMINECTOMY/DECOMPRESSION MICRODISCECTOMY Right 11/27/2012   Procedure: LUMBAR LAMINECTOMY/DECOMPRESSION MICRODISCECTOMY;  Surgeon: Emilee Hero, MD;  [facility];  Service: Orthopedics;  Laterality: Right;  Right-sided lumbar 5-sacral 1 microdisectomy     OB History   None      Home Medications    Prior to Admission medications   Medication Sig Start Date End Date Taking? Authorizing Provider  B Complex Vitamins (B COMPLEX PO) Take 2  tablets by mouth daily.    [provider]  Cholecalciferol (VITAMIN D PO) Take 5,000 tablets by mouth 2 (two) times daily.     [provider]  cyclobenzaprine (FLEXERIL) 5 MG tablet Take 5 mg by mouth 3 (three) times daily as needed for muscle spasms.    [provider]  MAGNESIUM PO Take 2 tablets by mouth daily.    [provider]    Family History Family History  Problem Relation Age of Onset  . Cancer Paternal Grandmother        ovarian    Social History Social History   Tobacco Use  . Smoking status: Never Smoker  . Smokeless tobacco: Never Used  Substance Use Topics  . Alcohol use: Not Currently  . Drug use: No     Allergies   Codeine; Minocycline; Penicillins; and Vicodin [hydrocodone-acetaminophen]   Review of Systems Review of Systems  Constitutional: Negative for chills and fever.  Respiratory: Negative for cough, chest tightness and shortness of breath.   Cardiovascular: Negative for chest pain, palpitations and leg swelling.  Gastrointestinal: Positive for abdominal pain, diarrhea and nausea. Negative for vomiting.  Genitourinary: Negative for dysuria, flank pain, pelvic pain, vaginal bleeding, vaginal discharge and vaginal pain.  Musculoskeletal: Negative for arthralgias, myalgias, neck pain and neck stiffness.  Skin: Negative for rash.  Neurological: Negative for dizziness, weakness and headaches.  All other systems reviewed and are negative.  Physical Exam Updated Vital Signs BP (!) 121/95 (BP Location: Left Arm)   Pulse 95   Temp 98.2 F (36.8 C) (Oral)   Resp 18   Ht 5\' 7"  (1.702 m)   Wt 87.1 kg (192 lb)   LMP 02/05/2018   SpO2 99%   BMI 30.07 kg/m   Physical Exam  Constitutional: She appears well-developed and well-nourished. No distress.  HENT:  Head: Normocephalic.  Eyes: Conjunctivae are normal.  Neck: Neck supple.  Cardiovascular: Normal rate, regular rhythm and normal heart sounds.    Pulmonary/Chest: Effort normal and breath sounds normal. No respiratory distress. She has no wheezes. She has no rales.  Abdominal: Soft. Bowel sounds are normal. She exhibits no distension. There is tenderness. There is no rebound.  Right upper quadrant tenderness.  Positive Murphy sign.  Musculoskeletal: She exhibits no edema.  Neurological: She is alert.  Skin: Skin is warm and dry.  Psychiatric: She has a normal mood and affect. Her behavior is normal.  Nursing note and vitals reviewed.    ED Treatments / Results  Labs (all labs ordered are listed, but only abnormal results are displayed) Labs Reviewed  URINALYSIS, ROUTINE W REFLEX MICROSCOPIC  PREGNANCY, URINE    EKG None  Radiology No results found.  Procedures Procedures (including critical care time)  Medications Ordered in ED Medications - No data to display   Initial Impression / Assessment and Plan / ED Course  I have reviewed the triage vital signs and the nursing notes.  Pertinent labs & imaging results that were available during my care of the patient were reviewed by me and considered in my medical decision making (see chart for details).    Patient is in no acute distress, states pain is minimal right now but comes and goes in severity.  States early was crying in pain.  She does not want any pain medications at this time.  Her symptoms and examination is concerning for possible cholecystitis.  We will check labs including LFTs and lipase and will get ultrasound.   9:56 PM  Ultrasound shows gallbladder sludge, however normal gallbladder wall and pericholecystic fluid.  Positive Murphy sign.  Elevated white blood cell count of 14.4.  Given this findings I spoke with general surgery, Dr. Daphine DeutscherMartin, who had low suspicion for acute cholecystitis since patient ultrasound is really unremarkable other than some gallbladder sludge.  Patient is currently in minimal pain as well.  He advised to have her follow-up  outpatient office her pain does not improve.  We will start her on low-fat diet, will give her prescription for nausea and pain medications as needed.  And will give her referral to general surgery.  We discussed strict return precautions.  Patient is comfortable going home.  Vitals:   02/14/18 1752 02/14/18 1753 02/14/18 1928  BP:  (!) 121/95 137/86  Pulse:  95 (!) 101  Resp:  18 16  Temp:  98.2 F (36.8 C)   TempSrc:  Oral   SpO2:  99% 99%  Weight: 87.1 kg (192 lb)    Height: 5\' 7"  (1.702 m)       Final Clinical Impressions(s) / ED Diagnoses   Final diagnoses:  Biliary colic    ED Discharge Orders        Ordered    ondansetron (ZOFRAN ODT) 8 MG disintegrating tablet  Every 8 hours PRN     02/14/18 2220    traMADol (ULTRAM) 50 MG tablet  Every 6 hours PRN  02/14/18 2220       Jaynie Crumble, PA-C 02/14/18 2326    Gwyneth Sprout, MD 02/15/18 [phone]

## 2018-02-14 NOTE — Discharge Instructions (Addendum)
Low-fat diet.  Take Tylenol for pain.  Take tramadol for severe pain.  Please follow-up with general surgery if you continue to have pain.  Return to emergency department if fever, worsening pain, vomiting.

## 2018-02-14 NOTE — ED Notes (Signed)
Pt returned from US

## 2018-02-14 NOTE — ED Notes (Signed)
Patient transported to Ultrasound 

## 2018-02-14 NOTE — ED Notes (Signed)
ED Provider at bedside. 

## 2018-02-16 DIAGNOSIS — J029 Acute pharyngitis, unspecified: Secondary | ICD-10-CM | POA: Diagnosis not present

## 2018-03-01 ENCOUNTER — Other Ambulatory Visit: Payer: Self-pay

## 2018-03-01 ENCOUNTER — Encounter (HOSPITAL_COMMUNITY): Payer: Self-pay | Admitting: *Deleted

## 2018-03-01 ENCOUNTER — Ambulatory Visit: Payer: Self-pay | Admitting: General Surgery

## 2018-03-01 DIAGNOSIS — K8050 Calculus of bile duct without cholangitis or cholecystitis without obstruction: Secondary | ICD-10-CM | POA: Diagnosis not present

## 2018-03-01 NOTE — Progress Notes (Signed)
Spoke with pt for pre-op call. Pt denies cardiac history. 

## 2018-03-03 NOTE — Anesthesia Preprocedure Evaluation (Addendum)
Anesthesia Evaluation  Patient identified by MRN, date of birth, ID band Patient awake    Reviewed: Allergy & Precautions, NPO status , Patient's Chart, lab work & pertinent test results  Airway Mallampati: I  TM Distance: >3 FB Neck ROM: Full    Dental  (+) Teeth Intact, Dental Advisory Given   Pulmonary neg pulmonary ROS,    breath sounds clear to auscultation       Cardiovascular negative cardio ROS   Rhythm:Regular Rate:Normal     Neuro/Psych negative neurological ROS  negative psych ROS   GI/Hepatic Neg liver ROS, Biliary colic   Endo/Other  negative endocrine ROS  Renal/GU negative Renal ROS     Musculoskeletal  (+) Arthritis , Osteoarthritis,    Abdominal   Peds  Hematology negative hematology ROS (+)   Anesthesia Other Findings Day of surgery medications reviewed with the patient.  Reproductive/Obstetrics                           Anesthesia Physical Anesthesia Plan  ASA: II  Anesthesia Plan: General   Post-op Pain Management:    Induction: Intravenous  PONV Risk Score and Plan: 3 and Midazolam, Dexamethasone, Ondansetron and Scopolamine patch - Pre-op  Airway Management Planned: Oral ETT  Additional Equipment:   Intra-op Plan:   Post-operative Plan: Extubation in OR  Informed Consent: I have reviewed the patients History and Physical, chart, labs and discussed the procedure including the risks, benefits and alternatives for the proposed anesthesia with the patient or authorized representative who has indicated his/her understanding and acceptance.   Dental advisory given  Plan Discussed with: CRNA  Anesthesia Plan Comments:        Anesthesia Quick Evaluation

## 2018-03-04 ENCOUNTER — Ambulatory Visit (HOSPITAL_COMMUNITY)
Admission: RE | Admit: 2018-03-04 | Discharge: 2018-03-04 | Disposition: A | Discharge status: Home / Self Care | Payer: 59 | Source: Ambulatory Visit | Attending: General Surgery | Admitting: General Surgery

## 2018-03-04 ENCOUNTER — Encounter (HOSPITAL_COMMUNITY)
Admission: RE | Disposition: A | Discharge status: Home / Self Care | Payer: Self-pay | Source: Ambulatory Visit | Attending: General Surgery

## 2018-03-04 ENCOUNTER — Ambulatory Visit (HOSPITAL_COMMUNITY): Payer: 59

## 2018-03-04 ENCOUNTER — Encounter (HOSPITAL_COMMUNITY): Payer: Self-pay | Admitting: Urology

## 2018-03-04 ENCOUNTER — Ambulatory Visit (HOSPITAL_COMMUNITY): Payer: 59 | Admitting: Anesthesiology

## 2018-03-04 DIAGNOSIS — Z880 Allergy status to penicillin: Secondary | ICD-10-CM | POA: Diagnosis not present

## 2018-03-04 DIAGNOSIS — M1990 Unspecified osteoarthritis, unspecified site: Secondary | ICD-10-CM | POA: Diagnosis not present

## 2018-03-04 DIAGNOSIS — K8050 Calculus of bile duct without cholangitis or cholecystitis without obstruction: Secondary | ICD-10-CM | POA: Diagnosis not present

## 2018-03-04 DIAGNOSIS — Z419 Encounter for procedure for purposes other than remedying health state, unspecified: Secondary | ICD-10-CM

## 2018-03-04 DIAGNOSIS — K824 Cholesterolosis of gallbladder: Secondary | ICD-10-CM | POA: Diagnosis not present

## 2018-03-04 DIAGNOSIS — E559 Vitamin D deficiency, unspecified: Secondary | ICD-10-CM | POA: Diagnosis not present

## 2018-03-04 DIAGNOSIS — Z885 Allergy status to narcotic agent status: Secondary | ICD-10-CM | POA: Diagnosis not present

## 2018-03-04 DIAGNOSIS — K828 Other specified diseases of gallbladder: Secondary | ICD-10-CM | POA: Diagnosis not present

## 2018-03-04 DIAGNOSIS — K811 Chronic cholecystitis: Secondary | ICD-10-CM | POA: Insufficient documentation

## 2018-03-04 HISTORY — PX: CHOLECYSTECTOMY: SHX55

## 2018-03-04 LAB — CBC
HCT: 38.8 % (ref 36.0–46.0)
Hemoglobin: 12.6 g/dL (ref 12.0–15.0)
MCH: 30.1 pg (ref 26.0–34.0)
MCHC: 32.5 g/dL (ref 30.0–36.0)
MCV: 92.8 fL (ref 78.0–100.0)
Platelets: 253 10*3/uL (ref 150–400)
RBC: 4.18 MIL/uL (ref 3.87–5.11)
RDW: 12.6 % (ref 11.5–15.5)
WBC: 4.8 10*3/uL (ref 4.0–10.5)

## 2018-03-04 LAB — POCT PREGNANCY, URINE: Preg Test, Ur: NEGATIVE

## 2018-03-04 SURGERY — LAPAROSCOPIC CHOLECYSTECTOMY WITH INTRAOPERATIVE CHOLANGIOGRAM
Anesthesia: General | Site: Abdomen

## 2018-03-04 MED ORDER — SUCCINYLCHOLINE CHLORIDE 200 MG/10ML IV SOSY
PREFILLED_SYRINGE | INTRAVENOUS | Status: AC
  Filled 2018-03-04: qty 10

## 2018-03-04 MED ORDER — FENTANYL CITRATE (PF) 100 MCG/2ML IJ SOLN
INTRAMUSCULAR | Status: DC | PRN
  Administered 2018-03-04: 50 ug via INTRAVENOUS

## 2018-03-04 MED ORDER — FENTANYL CITRATE (PF) 250 MCG/5ML IJ SOLN
INTRAMUSCULAR | Status: AC
  Filled 2018-03-04: qty 5

## 2018-03-04 MED ORDER — GABAPENTIN 300 MG PO CAPS
300.0000 mg | ORAL_CAPSULE | ORAL | Status: AC
  Administered 2018-03-04: 300 mg via ORAL
  Filled 2018-03-04: qty 1

## 2018-03-04 MED ORDER — HYDROMORPHONE HCL 1 MG/ML IJ SOLN
0.2500 mg | INTRAMUSCULAR | Status: DC | PRN
  Administered 2018-03-04: 0.25 mg via INTRAVENOUS

## 2018-03-04 MED ORDER — SODIUM CHLORIDE 0.9 % IV SOLN
INTRAVENOUS | Status: DC | PRN
  Administered 2018-03-04: 6 mL

## 2018-03-04 MED ORDER — DEXAMETHASONE SODIUM PHOSPHATE 10 MG/ML IJ SOLN
INTRAMUSCULAR | Status: DC | PRN
  Administered 2018-03-04: 10 mg via INTRAVENOUS

## 2018-03-04 MED ORDER — ROCURONIUM BROMIDE 50 MG/5ML IV SOSY
PREFILLED_SYRINGE | INTRAVENOUS | Status: AC
  Filled 2018-03-04: qty 5

## 2018-03-04 MED ORDER — TRAMADOL HCL 50 MG PO TABS
50.0000 mg | ORAL_TABLET | Freq: Once | ORAL | Status: AC
  Administered 2018-03-04: 50 mg via ORAL

## 2018-03-04 MED ORDER — CHLORHEXIDINE GLUCONATE CLOTH 2 % EX PADS: 6.0000 | MEDICATED_PAD | Freq: Once | CUTANEOUS | Status: DC

## 2018-03-04 MED ORDER — IOPAMIDOL (ISOVUE-300) INJECTION 61%
INTRAVENOUS | Status: AC
  Filled 2018-03-04: qty 50

## 2018-03-04 MED ORDER — LACTATED RINGERS IV SOLN
INTRAVENOUS | Status: DC
  Administered 2018-03-04 (×3): via INTRAVENOUS

## 2018-03-04 MED ORDER — LIDOCAINE 2% (20 MG/ML) 5 ML SYRINGE
INTRAMUSCULAR | Status: AC
  Filled 2018-03-04: qty 5

## 2018-03-04 MED ORDER — SCOPOLAMINE 1 MG/3DAYS TD PT72
MEDICATED_PATCH | TRANSDERMAL | Status: AC
  Filled 2018-03-04: qty 1

## 2018-03-04 MED ORDER — DEXAMETHASONE SODIUM PHOSPHATE 10 MG/ML IJ SOLN
INTRAMUSCULAR | Status: AC
  Filled 2018-03-04: qty 1

## 2018-03-04 MED ORDER — SCOPOLAMINE 1 MG/3DAYS TD PT72
MEDICATED_PATCH | TRANSDERMAL | Status: DC | PRN
  Administered 2018-03-04: 1 via TRANSDERMAL

## 2018-03-04 MED ORDER — CELECOXIB 200 MG PO CAPS
200.0000 mg | ORAL_CAPSULE | ORAL | Status: AC
  Administered 2018-03-04: 200 mg via ORAL
  Filled 2018-03-04: qty 1

## 2018-03-04 MED ORDER — ONDANSETRON HCL 4 MG/2ML IJ SOLN
INTRAMUSCULAR | Status: AC
  Filled 2018-03-04: qty 2

## 2018-03-04 MED ORDER — 0.9 % SODIUM CHLORIDE (POUR BTL) OPTIME
TOPICAL | Status: DC | PRN
  Administered 2018-03-04: 1000 mL

## 2018-03-04 MED ORDER — SODIUM CHLORIDE 0.9 % IR SOLN
Status: DC | PRN
Start: 2018-03-04 — End: 2018-03-04
  Administered 2018-03-04: 1000 mL

## 2018-03-04 MED ORDER — MIDAZOLAM HCL 2 MG/2ML IJ SOLN
INTRAMUSCULAR | Status: DC | PRN
  Administered 2018-03-04: 2 mg via INTRAVENOUS

## 2018-03-04 MED ORDER — PROPOFOL 10 MG/ML IV BOLUS
INTRAVENOUS | Status: DC | PRN
  Administered 2018-03-04: 120 mg via INTRAVENOUS

## 2018-03-04 MED ORDER — MIDAZOLAM HCL 2 MG/2ML IJ SOLN
INTRAMUSCULAR | Status: AC
  Filled 2018-03-04: qty 2

## 2018-03-04 MED ORDER — HYDROMORPHONE HCL 1 MG/ML IJ SOLN
INTRAMUSCULAR | Status: AC
  Filled 2018-03-04: qty 1

## 2018-03-04 MED ORDER — ROCURONIUM BROMIDE 10 MG/ML (PF) SYRINGE
PREFILLED_SYRINGE | INTRAVENOUS | Status: DC | PRN
  Administered 2018-03-04: 50 mg via INTRAVENOUS

## 2018-03-04 MED ORDER — TRAMADOL HCL 50 MG PO TABS
ORAL_TABLET | ORAL | Status: AC
  Filled 2018-03-04: qty 1

## 2018-03-04 MED ORDER — ONDANSETRON HCL 4 MG/2ML IJ SOLN
INTRAMUSCULAR | Status: DC | PRN
  Administered 2018-03-04: 4 mg via INTRAVENOUS

## 2018-03-04 MED ORDER — PROMETHAZINE HCL 25 MG/ML IJ SOLN
12.5000 mg | Freq: Four times a day (QID) | INTRAMUSCULAR | Status: DC | PRN
  Administered 2018-03-04: 12.5 mg via INTRAVENOUS

## 2018-03-04 MED ORDER — BUPIVACAINE-EPINEPHRINE (PF) 0.25% -1:200000 IJ SOLN
INTRAMUSCULAR | Status: AC
  Filled 2018-03-04: qty 30

## 2018-03-04 MED ORDER — CIPROFLOXACIN IN D5W 400 MG/200ML IV SOLN
400.0000 mg | INTRAVENOUS | Status: AC
  Administered 2018-03-04: 400 mg via INTRAVENOUS
  Filled 2018-03-04: qty 200

## 2018-03-04 MED ORDER — BUPIVACAINE-EPINEPHRINE 0.25% -1:200000 IJ SOLN
INTRAMUSCULAR | Status: DC | PRN
  Administered 2018-03-04: 28 mL

## 2018-03-04 MED ORDER — LIDOCAINE 2% (20 MG/ML) 5 ML SYRINGE
INTRAMUSCULAR | Status: DC | PRN
  Administered 2018-03-04: 80 mg via INTRAVENOUS

## 2018-03-04 MED ORDER — TRAMADOL HCL 50 MG PO TABS: 50.0000 mg | ORAL_TABLET | Freq: Four times a day (QID) | ORAL | 1 refills | Status: DC | PRN

## 2018-03-04 MED ORDER — PROPOFOL 10 MG/ML IV BOLUS
INTRAVENOUS | Status: AC
  Filled 2018-03-04: qty 20

## 2018-03-04 SURGICAL SUPPLY — 35 items
APPLIER CLIP 5 13 M/L LIGAMAX5 (MISCELLANEOUS) ×2
BLADE CLIPPER SURG (BLADE) IMPLANT
CANISTER SUCT 3000ML PPV (MISCELLANEOUS) ×2 IMPLANT
CATH REDDICK CHOLANGI 4FR 50CM (CATHETERS) ×2 IMPLANT
CHLORAPREP W/TINT 26ML (MISCELLANEOUS) ×2 IMPLANT
CLIP APPLIE 5 13 M/L LIGAMAX5 (MISCELLANEOUS) ×1 IMPLANT
COVER MAYO STAND STRL (DRAPES) ×2 IMPLANT
COVER SURGICAL LIGHT HANDLE (MISCELLANEOUS) ×2 IMPLANT
DERMABOND ADVANCED (GAUZE/BANDAGES/DRESSINGS) ×1
DERMABOND ADVANCED .7 DNX12 (GAUZE/BANDAGES/DRESSINGS) ×1 IMPLANT
DRAPE C-ARM 42X72 X-RAY (DRAPES) ×2 IMPLANT
ELECT REM PT RETURN 9FT ADLT (ELECTROSURGICAL) ×2
ELECTRODE REM PT RTRN 9FT ADLT (ELECTROSURGICAL) ×1 IMPLANT
GLOVE BIO SURGEON STRL SZ7.5 (GLOVE) ×2 IMPLANT
GOWN STRL REUS W/ TWL LRG LVL3 (GOWN DISPOSABLE) ×2 IMPLANT
GOWN STRL REUS W/TWL LRG LVL3 (GOWN DISPOSABLE) ×2
IV CATH 14GX2 1/4 (CATHETERS) ×2 IMPLANT
KIT BASIN OR (CUSTOM PROCEDURE TRAY) ×2 IMPLANT
KIT TURNOVER KIT B (KITS) ×2 IMPLANT
NS IRRIG 1000ML POUR BTL (IV SOLUTION) ×2 IMPLANT
PAD ARMBOARD 7.5X6 YLW CONV (MISCELLANEOUS) ×2 IMPLANT
POUCH SPECIMEN RETRIEVAL 10MM (ENDOMECHANICALS) ×2 IMPLANT
SCISSORS LAP 5X35 DISP (ENDOMECHANICALS) ×2 IMPLANT
SET IRRIG TUBING LAPAROSCOPIC (IRRIGATION / IRRIGATOR) ×2 IMPLANT
SLEEVE ENDOPATH XCEL 5M (ENDOMECHANICALS) ×4 IMPLANT
SPECIMEN JAR SMALL (MISCELLANEOUS) ×2 IMPLANT
SUT MNCRL AB 3-0 PS2 18 (SUTURE) ×2 IMPLANT
SUT MNCRL AB 4-0 PS2 18 (SUTURE) ×2 IMPLANT
TOWEL OR 17X24 6PK STRL BLUE (TOWEL DISPOSABLE) ×2 IMPLANT
TOWEL OR 17X26 10 PK STRL BLUE (TOWEL DISPOSABLE) ×2 IMPLANT
TRAY LAPAROSCOPIC MC (CUSTOM PROCEDURE TRAY) ×2 IMPLANT
TROCAR XCEL BLUNT TIP 100MML (ENDOMECHANICALS) ×2 IMPLANT
TROCAR XCEL NON-BLD 5MMX100MML (ENDOMECHANICALS) ×2 IMPLANT
TUBING INSUFFLATION (TUBING) ×2 IMPLANT
WATER STERILE IRR 1000ML POUR (IV SOLUTION) ×2 IMPLANT

## 2018-03-04 NOTE — H&P (Signed)
Melissa Barnes  [facility] Patient #: [id number] DOB: [birth year] Single / Language: [place] / Race: White Female   History of Present Illness  The patient is a 27 year old female who presents with abdominal pain. We are asked to see the patient in consultation by Dr. Gwyneth SproutWhitney Plunkett to evaluate her for gallstones. The patient is a 27 year old white female who began having right upper quadrant abdominal pain on July 20. Since that time her pain seems to come and go but she has had persistent nausea with eating any type of food. She underwent an ultrasound that did show sludge in the gallbladder but no stones or gallbladder wall thickening or ductal dilatation. Her liver functions were normal.   Past Surgical History  Spinal Surgery - Lower Back   Diagnostic Studies History  Colonoscopy  never Mammogram  never Pap Smear  1-5 years ago  Allergies Penicillin G Benzathine & Proc *PENICILLINS*  Hives. Methicillin Sodium *PENICILLINS*  Hives. Codeine Phosphate *ANALGESICS - OPIOID*  Hives. Allergies Reconciled   Medication History Femynor (0.25-35MG -MCG Tablet, Oral) Active. Zofran (Oral) Specific strength unknown - Active. Medications Reconciled  Social History  Caffeine use  Carbonated beverages, Tea. No alcohol use  No drug use  Tobacco use  Never smoker.  Family History Hypertension  Father. Ovarian Cancer  Family Members In General.  Pregnancy / Birth History  Age at menarche  11 years. Contraceptive History  Oral contraceptives. Gravida  0 Para  0 Regular periods   Other Problems  Back Pain     Review of Systems  General Not Present- Appetite Loss, Chills, Fatigue, Fever, Night Sweats, Weight Gain and Weight Loss. Skin Not Present- Change in Wart/Mole, Dryness, Hives, Jaundice, New Lesions, Non-Healing Wounds, Rash and Ulcer. HEENT Not Present- Earache, Hearing Loss, Hoarseness, Nose Bleed, Oral Ulcers, Ringing  in the Ears, Seasonal Allergies, Sinus Pain, Sore Throat, Visual Disturbances, Wears glasses/contact lenses and Yellow Eyes. Respiratory Not Present- Bloody sputum, Chronic Cough, Difficulty Breathing, Snoring and Wheezing. Breast Not Present- Breast Mass, Breast Pain, Nipple Discharge and Skin Changes. Cardiovascular Not Present- Chest Pain, Difficulty Breathing Lying Down, Leg Cramps, Palpitations, Rapid Heart Rate, Shortness of Breath and Swelling of Extremities. Gastrointestinal Present- Abdominal Pain and Nausea. Not Present- Bloating, Bloody Stool, Change in Bowel Habits, Chronic diarrhea, Constipation, Difficulty Swallowing, Excessive gas, Gets full quickly at meals, Hemorrhoids, Indigestion, Rectal Pain and Vomiting. Female Genitourinary Not Present- Frequency, Nocturia, Painful Urination, Pelvic Pain and Urgency. Musculoskeletal Not Present- Back Pain, Joint Pain, Joint Stiffness, Muscle Pain, Muscle Weakness and Swelling of Extremities. Neurological Not Present- Decreased Memory, Fainting, Headaches, Numbness, Seizures, Tingling, Tremor, Trouble walking and Weakness. Psychiatric Not Present- Anxiety, Bipolar, Change in Sleep Pattern, Depression, Fearful and Frequent crying. Endocrine Not Present- Cold Intolerance, Excessive Hunger, Hair Changes, Heat Intolerance, Hot flashes and New Diabetes. Hematology Not Present- Blood Thinners, Easy Bruising, Excessive bleeding, Gland problems, HIV and Persistent Infections.  Vitals  Weight: 187.6 lb Height: 67in Height was reported by patient. Body Surface Area: 1.97 m Body Mass Index: 29.38 kg/m  Temp.: 68F(Temporal)  Pulse: 85 (Regular)  BP: 122/88 (Sitting, Left Arm, Standard)       Physical Exam  General Mental Status-Alert. General Appearance-Consistent with stated age. Hydration-Well hydrated. Voice-Normal.  Head and Neck Head-normocephalic, atraumatic with no lesions or palpable  masses. Trachea-midline. Thyroid Gland Characteristics - normal size and consistency.  Eye Eyeball - Bilateral-Extraocular movements intact. Sclera/Conjunctiva - Bilateral-No scleral icterus.  Chest and Lung Exam Chest and lung  exam reveals -quiet, even and easy respiratory effort with no use of accessory muscles and on auscultation, normal breath sounds, no adventitious sounds and normal vocal resonance. Inspection Chest Wall - Normal. Back - normal.  Cardiovascular Cardiovascular examination reveals -normal heart sounds, regular rate and rhythm with no murmurs and normal pedal pulses bilaterally.  Abdomen Note: The abdomen is soft. There is mild to moderate tenderness in the right upper quadrant. There is no palpable mass.   Neurologic Neurologic evaluation reveals -alert and oriented x 3 with no impairment of recent or remote memory. Mental Status-Normal.  Musculoskeletal Normal Exam - Left-Upper Extremity Strength Normal and Lower Extremity Strength Normal. Normal Exam - Right-Upper Extremity Strength Normal and Lower Extremity Strength Normal.  Lymphatic Head & Neck  General Head & Neck Lymphatics: Bilateral - Description - Normal. Axillary  General Axillary Region: Bilateral - Description - Normal. Tenderness - Non Tender. Femoral & Inguinal  Generalized Femoral & Inguinal Lymphatics: Bilateral - Description - Normal. Tenderness - Non Tender.    Assessment & Plan BILIARY COLIC (K80.50) Impression: The patient appears to have sludge in the gallbladder and biliary colic. Because of the risk of further painful episodes and possible pancreatitis and think she would benefit from having her gallbladder removed. She would also like to have this done. I have discussed with her in detail the risks and benefits of the operation as well as some of the technical aspects and she understands and wishes to proceed. I will plan for a laparoscopic cholecystectomy  with intraoperative cholangiogram. Current Plans Pt Education - Gallstones: discussed with patient and provided information.

## 2018-03-04 NOTE — Transfer of Care (Signed)
Immediate Anesthesia Transfer of Care Note  Patient: Melissa Barnes  Procedure(s) Performed: LAPAROSCOPIC CHOLECYSTECTOMY WITH INTRAOPERATIVE CHOLANGIOGRAM ERAS PATHWAY (N/A Abdomen)  Patient Location: PACU  Anesthesia Type:General  Level of Consciousness: drowsy  Airway & Oxygen Therapy: Patient Spontanous Breathing and Patient connected to nasal cannula oxygen  Post-op Assessment: Report given to RN, Post -op Vital signs reviewed and stable and Patient moving all extremities  Post vital signs: Reviewed and stable  Last Vitals:  Vitals Value Taken Time  BP 119/75 03/04/2018  9:45 AM  Temp    Pulse 91 03/04/2018  9:47 AM  Resp 17 03/04/2018  9:47 AM  SpO2 100 % 03/04/2018  9:47 AM  Vitals shown include unvalidated device data.  Last Pain:  Vitals:   03/04/18 0945  TempSrc:   PainSc: (P) Asleep      Patients Stated Pain Goal: 4 (03/04/18 [id number])  Complications: No apparent anesthesia complications

## 2018-03-04 NOTE — Anesthesia Postprocedure Evaluation (Signed)
Anesthesia Post Note  Patient: Melissa Barnes  Procedure(s) Performed: LAPAROSCOPIC CHOLECYSTECTOMY WITH INTRAOPERATIVE CHOLANGIOGRAM ERAS PATHWAY (N/A Abdomen)     Patient location during evaluation: PACU Anesthesia Type: General Level of consciousness: awake and alert Pain management: pain level controlled Vital Signs Assessment: post-procedure vital signs reviewed and stable Respiratory status: spontaneous breathing, nonlabored ventilation, respiratory function stable and patient connected to nasal cannula oxygen Cardiovascular status: blood pressure returned to baseline and stable Postop Assessment: no apparent nausea or vomiting Anesthetic complications: no    Last Vitals:  Vitals:   03/04/18 1130 03/04/18 1158  BP: 101/67 98/72  Pulse: 61 60  Resp: 15 16  Temp:    SpO2: 99% 98%    Last Pain:  Vitals:   03/04/18 1147  TempSrc:   PainSc: 4                  Chelsey L Woodrum

## 2018-03-04 NOTE — Interval H&P Note (Signed)
History and Physical Interval Note:  03/04/2018 8:03 AM  Melissa Barnes  has presented today for surgery, with the diagnosis of biliary colic  The various methods of treatment have been discussed with the patient and family. After consideration of risks, benefits and other options for treatment, the patient has consented to  Procedure(s): LAPAROSCOPIC CHOLECYSTECTOMY WITH INTRAOPERATIVE CHOLANGIOGRAM ERAS PATHWAY (N/A) as a surgical intervention .  The patient's history has been reviewed, patient examined, no change in status, stable for surgery.  I have reviewed the patient's chart and labs.  Questions were answered to the patient's satisfaction.     TOTH III,PAUL S

## 2018-03-04 NOTE — Anesthesia Procedure Notes (Signed)
Procedure Name: Intubation Date/Time: 03/04/2018 8:29 AM Performed by: Leonor Liv, CRNA Pre-anesthesia Checklist: Patient identified, Emergency Drugs available, Suction available and Patient being monitored Patient Re-evaluated:Patient Re-evaluated prior to induction Oxygen Delivery Method: Circle System Utilized Preoxygenation: Pre-oxygenation with 100% oxygen Induction Type: IV induction Ventilation: Mask ventilation without difficulty Laryngoscope Size: Mac and 3 Grade View: Grade I Tube type: Oral Tube size: 7.0 mm Number of attempts: 1 Airway Equipment and Method: Stylet and Oral airway Placement Confirmation: ETT inserted through vocal cords under direct vision,  positive ETCO2 and breath sounds checked- equal and bilateral Secured at: 21 cm Tube secured with: Tape Dental Injury: Teeth and Oropharynx as per pre-operative assessment

## 2018-03-04 NOTE — Op Note (Addendum)
03/04/2018  9:36 AM  PATIENT:  Melissa Barnes  27 y.o. female  PRE-OPERATIVE DIAGNOSIS:  biliary colic  POST-OPERATIVE DIAGNOSIS:  biliary colic  PROCEDURE:  Procedure(s): LAPAROSCOPIC CHOLECYSTECTOMY WITH INTRAOPERATIVE CHOLANGIOGRAM ERAS PATHWAY (N/A)  SURGEON:  Surgeon(s) and Role:    * Griselda Mineroth, Paul III, MD - Primary  PHYSICIAN ASSISTANT:   ASSISTANTS: Myrtie SomanSharon Hitchcock, RNFA   ANESTHESIA:   local and general  EBL:  minimal   BLOOD ADMINISTERED:none  DRAINS: none   LOCAL MEDICATIONS USED:  MARCAINE     SPECIMEN:  Source of Specimen:  gallbladder  DISPOSITION OF SPECIMEN:  PATHOLOGY  COUNTS:  YES  TOURNIQUET:  * No tourniquets in log *  DICTATION: .Dragon Dictation     Procedure: After informed consent was obtained the patient was brought to the operating room and placed in the supine position on the operating room table. After adequate induction of general anesthesia the patient's abdomen was prepped with ChloraPrep allowed to dry and draped in usual sterile manner. An appropriate timeout was performed. The area below the umbilicus was infiltrated with quarter percent  Marcaine. A small incision was made with a 15 blade knife. The incision was carried down through the subcutaneous tissue bluntly with a hemostat and Army-Nav[employer] retractors. The linea alba was identified. The linea alba was incised with a 15 blade knife and each side was grasped with Coker clamps. The preperitoneal space was then probed with a hemostat until the peritoneum was opened and access was gained to the abdominal cavity. A 0 Vicryl pursestring stitch was placed in the fascia surrounding the opening. A Hassan cannula was then placed through the opening and anchored in place with the previously placed Vicryl purse string stitch. The abdomen was insufflated with carbon dioxide without difficulty. A laparoscope was inserted [facility] the [facility] in the right upper quadrant was inspected. Next the  epigastric region was infiltrated with % Marcaine. A small incision was made with a 15 blade knife. A 5 mm port was placed bluntly through this incision into the abdominal cavity under direct vision. Next 2 sites were chosen laterally on the right side of the abdomen for placement of 5 mm ports. Each of these areas was infiltrated with quarter percent Marcaine. Small stab incisions were made with a 15 blade knife. 5 mm ports were then placed bluntly through these incisions into the abdominal cavity under direct vision without difficulty. A blunt grasper was placed through the lateralmost 5 mm port and used to grasp the dome of the gallbladder and elevated anteriorly and superiorly. Another blunt grasper was placed through the other 5 mm port and used to retract the body and neck of the gallbladder. A dissector was placed through the epigastric port and using the electrocautery the peritoneal reflection at the gallbladder neck was opened. Blunt dissection was then carried out in this area until the gallbladder neck-cystic duct junction was readily identified and a good critical view window was created. A single clip was placed on the gallbladder neck. A small  ductotomy was made just below the clip with laparoscopic scissors. A 14-gauge Angiocath was then placed through the anterior abdominal wall under direct vision. A Reddick cholangiogram catheter was then placed through the Angiocath and flushed. The catheter was then placed in the cystic duct and anchored in place with a clip. A cholangiogram was obtained that showed no filling defects good emptying into the duodenum an adequate length on the cystic duct. The anchoring clip and catheters were  then removed from the patient. 3 clips were placed proximally on the cystic duct and the duct was divided between the 2 sets of clips. Posterior to this the cystic artery was identified and again dissected bluntly in a circumferential manner until a good window  was  created. 2 clips were placed proximally and one distally on the artery and the artery was divided between the 2 sets of clips. Next a laparoscopic hook cautery device was used to separate the gallbladder from the liver bed. Prior to completely detaching the gallbladder from the liver bed the liver bed was inspected and several small bleeding points were coagulated with the electrocautery until the area was completely hemostatic. The gallbladder was then detached the rest of it from the liver bed without difficulty. A laparoscopic bag was inserted through the hassan port. The laparoscope was moved to the epigastric port. The gallbladder was placed within the bag and the bag was sealed.  The bag with the gallbladder was then removed with the [facility] cannula through the infraumbilical port without difficulty. The fascial defect was then closed with the previously placed Vicryl pursestring stitch as well as with another figure-of-eight 0 Vicryl stitch. The liver bed was inspected again and found to be hemostatic. The abdomen was irrigated with copious amounts of saline until the effluent was clear. The ports were then removed under direct vision without difficulty and were found to be hemostatic. The gas was allowed to escape. The skin incisions were all closed with interrupted 4-0 Monocryl subcuticular stitches. Dermabond dressings were applied. The patient tolerated the procedure well. At the end of the case all needle sponge and instrument counts were correct. The patient was then awakened and taken to recovery in stable condition  PLAN OF CARE: Discharge to home after PACU  PATIENT DISPOSITION:  PACU - hemodynamically stable.   Delay start of Pharmacological VTE agent (>24hrs) due to surgical blood loss or risk of bleeding: not applicable

## 2018-03-05 ENCOUNTER — Encounter (HOSPITAL_COMMUNITY): Payer: Self-pay | Admitting: General Surgery

## 2018-04-05 MED FILL — FEMYNOR 0.25-35 MG-MCG TABS: 0.25-35 | 84 days supply | Qty: 84 | Fill #1

## 2018-06-11 DIAGNOSIS — J029 Acute pharyngitis, unspecified: Secondary | ICD-10-CM | POA: Diagnosis not present

## 2018-07-01 MED FILL — FEMYNOR 0.25-35 MG-MCG TABS: 0.25-35 | 84 days supply | Qty: 84 | Fill #2

## 2018-09-25 MED FILL — FEMYNOR 0.25-35 MG-MCG TABS: 0.25-35 | 84 days supply | Qty: 84 | Fill #3

## 2018-12-12 MED FILL — FEMYNOR 0.25-35 MG-MCG TABS: 0.25-35 | 84 days supply | Qty: 84 | Fill #0

## 2018-12-17 DIAGNOSIS — Z3040 Encounter for surveillance of contraceptives, unspecified: Secondary | ICD-10-CM | POA: Diagnosis not present

## 2018-12-17 DIAGNOSIS — Z6830 Body mass index (BMI) 30.0-30.9, adult: Secondary | ICD-10-CM | POA: Diagnosis not present

## 2018-12-17 DIAGNOSIS — Z01419 Encounter for gynecological examination (general) (routine) without abnormal findings: Secondary | ICD-10-CM | POA: Diagnosis not present

## 2019-02-19 DIAGNOSIS — M25519 Pain in unspecified shoulder: Secondary | ICD-10-CM | POA: Diagnosis not present

## 2019-02-25 ENCOUNTER — Other Ambulatory Visit: Payer: Self-pay | Admitting: Family Medicine

## 2019-02-25 DIAGNOSIS — M25512 Pain in left shoulder: Secondary | ICD-10-CM

## 2019-03-10 ENCOUNTER — Ambulatory Visit (INDEPENDENT_AMBULATORY_CARE_PROVIDER_SITE_OTHER): Payer: 59

## 2019-03-10 ENCOUNTER — Other Ambulatory Visit: Payer: Self-pay

## 2019-03-10 DIAGNOSIS — M25512 Pain in left shoulder: Secondary | ICD-10-CM | POA: Diagnosis not present

## 2019-03-26 MED FILL — FEMYNOR 0.25-35 MG-MCG TABS: 0.25-35 | 84 days supply | Qty: 84 | Fill #0

## 2019-06-02 MED FILL — FEMYNOR 0.25-35 MG-MCG TABS: 0.25-35 | 84 days supply | Qty: 84 | Fill #1

## 2019-09-01 DIAGNOSIS — J029 Acute pharyngitis, unspecified: Secondary | ICD-10-CM | POA: Diagnosis not present

## 2019-09-02 DIAGNOSIS — J029 Acute pharyngitis, unspecified: Secondary | ICD-10-CM | POA: Diagnosis not present

## 2019-09-10 MED FILL — FEMYNOR 0.25-35 MG-MCG TABS: 0.25-35 | 84 days supply | Qty: 84 | Fill #2

## 2019-11-20 ENCOUNTER — Other Ambulatory Visit: Payer: Self-pay

## 2019-11-20 ENCOUNTER — Ambulatory Visit (INDEPENDENT_AMBULATORY_CARE_PROVIDER_SITE_OTHER)
Admission: RE | Admit: 2019-11-20 | Discharge: 2019-11-20 | Disposition: A | Discharge status: Home / Self Care | Payer: 59 | Source: Ambulatory Visit

## 2019-11-20 DIAGNOSIS — N3090 Cystitis, unspecified without hematuria: Secondary | ICD-10-CM

## 2019-11-20 MED ORDER — NITROFURANTOIN MONOHYD MACRO 100 MG PO CAPS: 100.0000 mg | ORAL_CAPSULE | Freq: Two times a day (BID) | ORAL | 0 refills | Status: DC

## 2019-11-20 MED FILL — NITROFURANTOIN MONO-MCR 100: 100 | 5 days supply | Qty: 10 | Fill #0

## 2019-11-20 NOTE — ED Provider Notes (Signed)
Virtual Visit via Video Note:  Melissa Barnes  initiated request for Telemedicine visit with [facility]gent [facility] team. I connected with Melissa Barnes  on 11/20/2019 at 9:35 AM  for a synchronized telemedicine visit using a video enabled HIPPA compliant telemedicine application. I verified that I am speaking with Melissa Barnes  using two identifiers. Mickie Bail, NP  was physically located in a [facility] Urgent care site and Melissa Barnes was located at a different location.   The limitations of evaluation and management by telemedicine as well as the availability of in-person appointments were discussed. Patient was informed that she  may incur a bill ( including co-pay) for this virtual visit encounter. Melissa Barnes  expressed understanding and gave verbal consent to proceed with virtual visit.     History of Present Illness:Melissa Barnes  is a 29 y.o. female presents for evaluation of 1 week history of urinary frequency and urgency.  She denies dysuria, fever, chills, abdominal pain, back pain, vaginal symptoms, pelvic pain, or other symptoms.  No treatment attempted at home.  Patient is not diabetic and has not family history of DM.   She denies current pregnancy or breastfeeding.     Allergies  Allergen Reactions  . Minocycline Hives  . Penicillins Hives  . Vicodin [Hydrocodone-Acetaminophen] Hives  . Codeine Hives     Past Medical History:  Diagnosis Date  . Anemia   . DDD (degenerative disc disease)   . Unspecified vitamin D deficiency      Social History   Tobacco Use  . Smoking status: Never Smoker  . Smokeless tobacco: Never Used  Substance Use Topics  . Alcohol use: Not Currently  . Drug use: No   ROS: as stated in HPI.  All other systems reviewed and negative.      Observations/Objective: Physical Exam  VITALS: Patient denies fever. GENERAL: Alert, appears well and in no acute distress. HEENT: Atraumatic. NECK: Normal movements of  the head and neck. CARDIOPULMONARY: No increased WOB. Speaking in clear sentences. I:E ratio WNL.  MS: Moves all visible extremities without noticeable abnormality. PSYCH: Pleasant and cooperative, well-groomed. Speech normal rate and rhythm. Affect is appropriate. Insight and judgement are appropriate. Attention is focused, linear, and appropriate.  NEURO: CN grossly intact. Oriented as arrived to appointment on time with no prompting. Moves both UE equally.  SKIN: No obvious lesions, wounds, erythema, or cyanosis noted on face or hands.   Assessment and Plan:    ICD-10-CM   1. Cystitis  N30.90        Follow Up Instructions: Treating with Macrobid.  Directed patient to follow-up with her PCP or come here to be seen in person if her symptoms or not improving.  Patient agrees with plan of care.    I discussed the assessment and treatment plan with the patient. The patient was provided an opportunity to ask questions and all were answered. The patient agreed with the plan and demonstrated an understanding of the instructions.   The patient was advised to call back or seek an in-person evaluation if the symptoms worsen or if the condition fails to improve as anticipated.      Mickie Bail, NP  11/20/2019 9:35 AM         Mickie Bail, NP 11/20/19 [phone]

## 2019-11-20 NOTE — Discharge Instructions (Addendum)
Take the Macrobid as directed.    Follow up with your primary care provider or come here to be seen in person if your symptoms are not improving.

## 2019-12-30 DIAGNOSIS — Z6838 Body mass index (BMI) 38.0-38.9, adult: Secondary | ICD-10-CM | POA: Diagnosis not present

## 2019-12-30 DIAGNOSIS — Z01419 Encounter for gynecological examination (general) (routine) without abnormal findings: Secondary | ICD-10-CM | POA: Diagnosis not present

## 2020-01-05 DIAGNOSIS — J029 Acute pharyngitis, unspecified: Secondary | ICD-10-CM | POA: Diagnosis not present

## 2020-01-06 ENCOUNTER — Telehealth: Payer: Self-pay

## 2020-01-06 ENCOUNTER — Other Ambulatory Visit: Payer: Self-pay

## 2020-01-06 ENCOUNTER — Telehealth: Payer: 59 | Admitting: Family

## 2020-01-06 DIAGNOSIS — B9689 Other specified bacterial agents as the cause of diseases classified elsewhere: Secondary | ICD-10-CM

## 2020-01-06 DIAGNOSIS — J028 Acute pharyngitis due to other specified organisms: Secondary | ICD-10-CM | POA: Diagnosis not present

## 2020-01-06 MED ORDER — AZITHROMYCIN 250 MG PO TABS: ORAL_TABLET | ORAL | 0 refills | Status: DC

## 2020-01-06 NOTE — Progress Notes (Signed)
We are sorry that you are not feeling well.  Here is how we plan to help!  Based on what you have shared with me it is likely that you have strep pharyngitis.  Strep pharyngitis is inflammation and infection in the back of the throat.  This is an infection cause by bacteria and is treated with antibiotics.  I have prescribed Azithromycin 250 mg two tablets today and then one daily for 4 additional days. For throat pain, we recommend over the counter oral pain relief medications such as acetaminophen or aspirin, or anti-inflammatory medications such as ibuprofen or naproxen sodium. Topical treatments such as oral throat lozenges or sprays may be used as needed. Strep infections are not as easily transmitted as other respiratory infections, however we still recommend that you avoid close contact with loved ones, especially the very young and elderly.  Remember to wash your hands thoroughly throughout the day as this is the number one way to prevent the spread of infection and wipe down door knobs and counters with disinfectant.   Home Care:  Only take medications as instructed by your medical team.  Complete the entire course of an antibiotic.  Do not take these medications with alcohol.  A steam or ultrasonic humidifier can help congestion.  You can place a towel over your head and breathe in the steam from hot water coming from a faucet.  Avoid close contacts especially the very young and the elderly.  Cover your mouth when you cough or sneeze.  Always remember to wash your hands.  Get Help Right Away If:  You develop worsening fever or sinus pain.  You develop a severe head ache or visual changes.  Your symptoms persist after you have completed your treatment plan.  Make sure you  Understand these instructions.  Will watch your condition.  Will get help right away if you are not doing well or get worse.  Your e-visit answers were reviewed by a board certified advanced clinical  practitioner to complete your personal care plan.  Depending on the condition, your plan could have included both over the counter or prescription medications.  If there is a problem please reply  once you have received a response from your provider.  Your safety is important to us.  If you have drug allergies check your prescription carefully.    You can use MyChart to ask questions about today's visit, request a non-urgent call back, or ask for a work or school excuse for 24 hours related to this e-Visit. If it has been greater than 24 hours you will need to follow up with your provider, or enter a new e-Visit to address those concerns.  You will get an e-mail in the next two days asking about your experience.  I hope that your e-visit has been valuable and will speed your recovery. Thank you for using e-visits.  Greater than 5 minutes, yet less than 10 minutes of time have been spent researching, coordinating, and implementing care for this patient today.  Thank you for the details you included in the comment boxes. Those details are very helpful in determining the best course of treatment for you and help us to provide the best care.  

## 2020-03-02 DIAGNOSIS — Z23 Encounter for immunization: Secondary | ICD-10-CM | POA: Diagnosis not present

## 2020-03-23 DIAGNOSIS — Z23 Encounter for immunization: Secondary | ICD-10-CM | POA: Diagnosis not present

## 2020-04-07 DIAGNOSIS — A630 Anogenital (venereal) warts: Secondary | ICD-10-CM | POA: Diagnosis not present

## 2020-04-07 DIAGNOSIS — N9089 Other specified noninflammatory disorders of vulva and perineum: Secondary | ICD-10-CM | POA: Diagnosis not present

## 2020-10-23 ENCOUNTER — Other Ambulatory Visit: Payer: Self-pay

## 2020-10-23 ENCOUNTER — Emergency Department
Admission: EM | Admit: 2020-10-23 | Discharge: 2020-10-23 | Disposition: A | Discharge status: Home / Self Care | Payer: No Typology Code available for payment source | Source: Home / Self Care

## 2020-10-23 DIAGNOSIS — J029 Acute pharyngitis, unspecified: Secondary | ICD-10-CM

## 2020-10-23 LAB — POCT RAPID STREP A (OFFICE): Rapid Strep A Screen: NEGATIVE

## 2020-10-23 MED ORDER — AZITHROMYCIN 250 MG PO TABS: ORAL_TABLET | ORAL | 0 refills | Status: DC

## 2020-10-23 NOTE — Discharge Instructions (Signed)
Drink plenty of fluids Take antibiotic as directed May take Tylenol, ibuprofen, Aleve for throat pain.  May use sore throat lozenges and gargles Check MyChart for your test result May stop antibiotic early if your culture is negative

## 2020-10-23 NOTE — ED Provider Notes (Signed)
[facility]    CSN: [id number] Arrival date & time: 10/23/20  1240      History   Chief Complaint Chief Complaint  Patient presents with  . Sore Throat    Pt states that she has a sore throat. Pt states that she is prone to strep. X1 days    HPI Melissa Barnes is a 30 y.o. female.   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  She states that she gets strep throat every year or so.  She states that she has seen an ENT provider in the past.  Still has her tonsils.  Currently has had sore throat since yesterday.  Painful to swallow.  No fever or chills.  No headache or body aches.  No known exposure to Covid.  She has completed the Covid vaccination.  Past Medical History:  Diagnosis Date  . Anemia   . DDD (degenerative disc disease)   . Unspecified vitamin D deficiency     Patient Active Problem List   Diagnosis Date Noted  . Unspecified vitamin D deficiency 06/02/2013  . DDD (degenerative disc disease) 06/02/2013  . Other B-complex deficiencies 06/02/2013    Past Surgical History:  Procedure Laterality Date  . CHOLECYSTECTOMY N/A 03/04/2018   Procedure: LAPAROSCOPIC CHOLECYSTECTOMY WITH INTRAOPERATIVE CHOLANGIOGRAM ERAS PATHWAY;  Surgeon: Griselda Miner, MD;  Location: [facility] OR;  Service: General;  Laterality: N/A;  . CYST REMOVAL TRUNK Right 2009   chest wall  . LUMBAR LAMINECTOMY/DECOMPRESSION MICRODISCECTOMY Right 11/27/2012   Procedure: LUMBAR LAMINECTOMY/DECOMPRESSION MICRODISCECTOMY;  Surgeon: Emilee Hero, MD;  Location: [facility] OR;  Service: Orthopedics;  Laterality: Right;  Right-sided lumbar 5-sacral 1 microdisectomy  . WISDOM TOOTH EXTRACTION      OB History   No obstetric history on file.      Home Medications    Prior to Admission medications   Medication Sig Start Date End Date Taking? Authorizing Provider  azithromycin (ZITHROMAX) 250 MG tablet 2 tabs today then 1 tab daily x 4 more days 10/23/20   Eustace Moore, MD   norgestimate-ethinyl estradiol (ORTHO-CYCLEN,SPRINTEC,PREVIFEM) 0.25-35 MG-MCG tablet Take 1 tablet by mouth daily.  10/23/20  [provider]    Family History Family History  Problem Relation Age of Onset  . Hypertension Father   . Cancer Paternal Grandmother        ovarian    Social History Social History   Tobacco Use  . Smoking status: Never Smoker  . Smokeless tobacco: Never Used  Vaping Use  . Vaping Use: Never used  Substance Use Topics  . Alcohol use: Not Currently  . Drug use: No     Allergies   Minocycline, Penicillins, Vicodin [hydrocodone-acetaminophen], and Codeine   Review of Systems Review of Systems See HPI  Physical Exam Triage Vital Signs ED Triage Vitals  Enc Vitals Group     BP 10/23/20 1254 111/73     Pulse Rate 10/23/20 1254 82     Resp 10/23/20 1256 20     Temp 10/23/20 1254 99.1 F (37.3 C)     Temp Source 10/23/20 1254 Oral     SpO2 10/23/20 1254 97 %     Weight 10/23/20 1251 193 lb (87.5 kg)     Height 10/23/20 1251 5\' 7"  (1.702 m)     Head Circumference --      Peak Flow --      Pain Score 10/23/20 1251 4     Pain Loc --  Pain Edu? --      Excl. in GC? --    No data found.  Updated Vital Signs BP 111/73 (BP Location: Left Arm)   Pulse 82   Temp 99.1 F (37.3 C) (Oral)   Resp 20   Ht 5\' 7"  (1.702 m)   Wt 87.5 kg   LMP 10/08/2020   SpO2 97%   BMI 30.23 kg/m     Physical Exam Constitutional:      General: She is not in acute distress.    Appearance: She is well-developed. She is not ill-appearing.  HENT:     Head: Normocephalic and atraumatic.     Nose: No congestion.     Mouth/Throat:     Pharynx: Uvula midline. Posterior oropharyngeal erythema present.     Tonsils: No tonsillar exudate or tonsillar abscesses. 1+ on the right. 1+ on the left.     Comments: Posterior pharynx and tonsil area is erythematous with no exudate.  Uvula slightly swollen.  Small ulcerated lesion on soft palate. Eyes:      Conjunctiva/sclera: Conjunctivae normal.     Pupils: Pupils are equal, round, and reactive to light.  Cardiovascular:     Rate and Rhythm: Normal rate.  Pulmonary:     Effort: Pulmonary effort is normal. No respiratory distress.  Abdominal:     General: There is no distension.     Palpations: Abdomen is soft.  Musculoskeletal:        General: Normal range of motion.     Cervical back: Normal range of motion.  Lymphadenopathy:     Cervical: No cervical adenopathy.  Skin:    General: Skin is warm and dry.  Neurological:     Mental Status: She is alert.  Psychiatric:        Behavior: Behavior normal.      UC Treatments / Results  Labs (all labs ordered are listed, but only abnormal results are displayed) Labs Reviewed  STREP A DNA PROBE  POCT RAPID STREP A (OFFICE)    EKG   Radiology No results found.  Procedures Procedures (including critical care time)  Medications Ordered in UC Medications - No data to display  Initial Impression / Assessment and Plan / UC Course  I have reviewed the triage vital signs and the nursing notes.  Pertinent labs & imaging results that were available during my care of the patient were reviewed by me and considered in my medical decision making (see chart for details).     Discussed that the rapid strep is negative.  This is not 100% diagnostic.  Throat culture will be sent.  Patient states because of her frequent strep infections, and the painful throat she prefers to be on an antibiotic pending strep culture result.  She understands that if her culture result is negative, she should stop the antibiotics.  Symptomatic care discussed Final Clinical Impressions(s) / UC Diagnoses   Final diagnoses:  Acute pharyngitis, unspecified etiology     Discharge Instructions     Drink plenty of fluids Take antibiotic as directed May take Tylenol, ibuprofen, Aleve for throat pain.  May use sore throat lozenges and gargles Check MyChart for  your test result May stop antibiotic early if your culture is negative   ED Prescriptions    Medication Sig Dispense Auth. Provider   azithromycin (ZITHROMAX) 250 MG tablet 2 tabs today then 1 tab daily x 4 more days 6 tablet 10/10/2020, MD     PDMP not reviewed  this encounter.   Eustace Moore, MD 10/23/20 1331

## 2020-10-23 NOTE — ED Triage Notes (Signed)
Pt states that she has a sore throat. X 1day. Pt states that she is vaccinated.

## 2020-10-25 LAB — TIQ- AMBIGUOUS ORDER

## 2020-10-25 NOTE — ED Notes (Signed)
Per [facility]- incorrect strep culture order submitted with specimen. [facility] is unable to run the desired strep culture at this time.

## 2020-10-27 ENCOUNTER — Telehealth: Payer: Self-pay

## 2020-10-27 NOTE — Telephone Encounter (Signed)
Pt called for strep cx results. Lab test code number was changed but found out from lab that swab was already no good by the time change was made. Offered pt to come back in and swab, pt is currently being tx with antibiotics so she said she will just continue to take antibiotics and follow up with PCP next week if not better.

## 2020-12-13 ENCOUNTER — Emergency Department
Admission: EM | Admit: 2020-12-13 | Discharge: 2020-12-13 | Disposition: A | Discharge status: Home / Self Care | Payer: No Typology Code available for payment source | Source: Home / Self Care | Attending: Family Medicine | Admitting: Family Medicine

## 2020-12-13 ENCOUNTER — Encounter: Payer: Self-pay | Admitting: Emergency Medicine

## 2020-12-13 ENCOUNTER — Other Ambulatory Visit: Payer: Self-pay

## 2020-12-13 DIAGNOSIS — N946 Dysmenorrhea, unspecified: Secondary | ICD-10-CM

## 2020-12-13 DIAGNOSIS — R112 Nausea with vomiting, unspecified: Secondary | ICD-10-CM | POA: Diagnosis not present

## 2020-12-13 DIAGNOSIS — R197 Diarrhea, unspecified: Secondary | ICD-10-CM | POA: Diagnosis not present

## 2020-12-13 LAB — POC SARS CORONAVIRUS 2 AG -  ED: SARS Coronavirus 2 Ag: NEGATIVE

## 2020-12-13 LAB — POCT URINE PREGNANCY: Preg Test, Ur: NEGATIVE

## 2020-12-13 MED ORDER — CYCLOBENZAPRINE HCL 5 MG PO TABS: ORAL_TABLET | ORAL | 1 refills | Status: DC

## 2020-12-13 MED ORDER — ONDANSETRON 4 MG PO TBDP: ORAL_TABLET | ORAL | 0 refills | Status: DC

## 2020-12-13 MED ORDER — ONDANSETRON 4 MG PO TBDP
4.0000 mg | ORAL_TABLET | Freq: Once | ORAL | Status: AC
Start: 2020-12-13 — End: 2020-12-13
  Administered 2020-12-13: 4 mg via ORAL

## 2020-12-13 NOTE — ED Triage Notes (Signed)
Patient c/o diarrhea and vomiting since this morning.  Patient did have sinus issues and drainage about 10 days ago.  Patient has taken Zofran and Aleve.  Patient is vaccinated for COVID.

## 2020-12-13 NOTE — ED Notes (Signed)
COVID PCR sent to [facility] not [facility]. Cancel [facility] test

## 2020-12-13 NOTE — Discharge Instructions (Addendum)
Begin Pedialyte for about 12 to 18 hours until diarrhea stops, then switch to clear liquids (apple juice, clear grape juice, Jello, etc) for about 12 to 18 hours.  When improved, advance to a [employer] (Bananas, Rice, Applesauce, Toast). Then gradually resume a regular diet when tolerated.  Avoid milk products until well.     If your COVID-19 test is positive, isolate yourself for five days from the time of your symptom onset or date of testing (if you have no symptoms).  At the end of five days you may end isolation if your symptoms have cleared or improved, and you have not had a fever for 24 hours. At this time you should wear a mask for five more days when you are around others.   If you are unable to wear a mask at work, you should return to work after 10 days.     If symptoms become significantly worse during the night or over the weekend, proceed to the local emergency room.

## 2020-12-13 NOTE — ED Provider Notes (Signed)
[name]    CSN: [id number] Arrival date & time: 12/13/20  1325      History   Chief Complaint Chief Complaint  Patient presents with  . Emesis    HPI Melissa Barnes is a 30 y.o. female.   Patient awoke this morning with nausea, vomiting, watery diarrhea, and fatigue.  Her vomiting resolved with Zofran ODT, and she has had no further diarrhea.  She denies fevers, chills, and sweats.  Eight days ago she had upper respiratory symptoms with sore throat and sinus congestion, now improved. Patient had COVID infection in January.  The history is provided by the patient.    Past Medical History:  Diagnosis Date  . Anemia   . DDD (degenerative disc disease)   . Unspecified vitamin D deficiency     Patient Active Problem List   Diagnosis Date Noted  . Unspecified vitamin D deficiency 06/02/2013  . DDD (degenerative disc disease) 06/02/2013  . Other B-complex deficiencies 06/02/2013    Past Surgical History:  Procedure Laterality Date  . CHOLECYSTECTOMY N/A 03/04/2018   Procedure: LAPAROSCOPIC CHOLECYSTECTOMY WITH INTRAOPERATIVE CHOLANGIOGRAM ERAS PATHWAY;  Surgeon: Griselda Miner, MD;  Location: [facility] OR;  Service: General;  Laterality: N/A;  . CYST REMOVAL TRUNK Right 2009   chest wall  . LUMBAR LAMINECTOMY/DECOMPRESSION MICRODISCECTOMY Right 11/27/2012   Procedure: LUMBAR LAMINECTOMY/DECOMPRESSION MICRODISCECTOMY;  Surgeon: Emilee Hero, MD;  Location: [facility] OR;  Service: Orthopedics;  Laterality: Right;  Right-sided lumbar 5-sacral 1 microdisectomy  . WISDOM TOOTH EXTRACTION      OB History   No obstetric history on file.      Home Medications    Prior to Admission medications   Medication Sig Start Date End Date Taking? Authorizing Provider  cyclobenzaprine (FLEXERIL) 5 MG tablet Take one tab PO BID to TID prn muscle aches 12/13/20  Yes Lattie Haw, MD  ondansetron (ZOFRAN ODT) 4 MG disintegrating tablet Take one tab by mouth Q6hr prn  nausea 12/13/20  Yes Lattie Haw, MD  Prenatal Vit-Fe Fumarate-FA (PRENATAL PO) Take by mouth.   Yes [provider]  VITAMIN D PO Take by mouth.   Yes [provider]  azithromycin (ZITHROMAX) 250 MG tablet 2 tabs today then 1 tab daily x 4 more days 10/23/20   Eustace Moore, MD  norgestimate-ethinyl estradiol (ORTHO-CYCLEN,SPRINTEC,PREVIFEM) 0.25-35 MG-MCG tablet Take 1 tablet by mouth daily.  10/23/20  [provider]    Family History Family History  Problem Relation Age of Onset  . Hypertension Father   . Cancer Paternal Grandmother        ovarian    Social History Social History   Tobacco Use  . Smoking status: Never Smoker  . Smokeless tobacco: Never Used  Vaping Use  . Vaping Use: Never used  Substance Use Topics  . Alcohol use: Not Currently  . Drug use: No     Allergies   Minocycline, Penicillins, Vicodin [hydrocodone-acetaminophen], and Codeine   Review of Systems Review of Systems  + sore throat, resolved No cough No pleuritic pain No wheezing + nasal congestion + post-nasal drainage No sinus pain/pressure No itchy/red eyes No earache No hemoptysis No SOB No fever/chills + nausea + vomiting No abdominal pain + diarrhea No urinary symptoms No skin rash + fatigue + myalgias No headache    Physical Exam Triage Vital Signs ED Triage Vitals  Enc Vitals Group     BP 12/13/20 1453 95/67     Pulse Rate  12/13/20 1453 (!) 116     Resp --      Temp 12/13/20 1453 98.6 F (37 C)     Temp Source 12/13/20 1453 Oral     SpO2 12/13/20 1453 98 %     Weight --      Height --      Head Circumference --      Peak Flow --      Pain Score 12/13/20 1454 8     Pain Loc --      Pain Edu? --      Excl. in GC? --    No data found.  Updated Vital Signs BP 95/67 (BP Location: Right Arm)   Pulse (!) 116   Temp 98.6 F (37 C) (Oral)   LMP 11/07/2020   SpO2 98%   Visual Acuity Right Eye Distance:   Left Eye  Distance:   Bilateral Distance:    Right Eye Near:   Left Eye Near:    Bilateral Near:     Physical Exam Nursing notes and Vital Signs reviewed. Appearance:  Patient appears stated age, and in no acute distress Eyes:  Pupils are equal, round, and reactive to light and accomodation.  Extraocular movement is intact.  Conjunctivae are not inflamed  Ears:  Canals normal.  Tympanic membranes normal.  Nose:   Normal turbinates.  No sinus tenderness.  Pharynx:  Normal; moist mucous membranes  Neck:  Supple.  Mildly enlarged lateral nodes are present, tender to palpation on the left.   Lungs:  Clear to auscultation.  Breath sounds are equal.  Moving air well. Heart:  Regular rate and rhythm without murmurs, rubs, or gallops.  Abdomen:  Nontender without masses or hepatosplenomegaly.  Bowel sounds are increased.  No CVA or flank tenderness.  Extremities:  No edema.  Skin:  No rash present.   UC Treatments / Results  Labs (all labs ordered are listed, but only abnormal results are displayed) Labs Reviewed  NOVEL CORONAVIRUS, NAA  SARS-COV-2 RNA,(COVID-19) QUALITATIVE NAAT  POCT URINE PREGNANCY negative  POC SARS CORONAVIRUS 2 AG -  ED negative    EKG   Radiology No results found.  Procedures Procedures (including critical care time)  Medications Ordered in UC Medications  ondansetron (ZOFRAN-ODT) disintegrating tablet 4 mg (4 mg Oral Given 12/13/20 1506)    Initial Impression / Assessment and Plan / UC Course  I have reviewed the triage vital signs and the nursing notes.  Pertinent labs & imaging results that were available during my care of the patient were reviewed by me and considered in my medical decision making (see chart for details).    Suspect viral syndrome.  COVID19 PCR pending. Patient requests an Rx for low dose Flexeril which has been helpful for myalgias in the past. Followup with Family Doctor if not improved in one week.    Final Clinical Impressions(s) /  UC Diagnoses   Final diagnoses:  Dysmenorrhea  Nausea vomiting and diarrhea     Discharge Instructions     Begin Pedialyte for about 12 to 18 hours until diarrhea stops, then switch to clear liquids (apple juice, clear grape juice, Jello, etc) for about 12 to 18 hours.  When improved, advance to a [employer] (Bananas, Rice, Applesauce, Toast). Then gradually resume a regular diet when tolerated.  Avoid milk products until well.     If your COVID-19 test is positive, isolate yourself for five days from the time of your symptom onset or date  of testing (if you have no symptoms).  At the end of five days you may end isolation if your symptoms have cleared or improved, and you have not had a fever for 24 hours. At this time you should wear a mask for five more days when you are around others.   If you are unable to wear a mask at work, you should return to work after 10 days.     If symptoms become significantly worse during the night or over the weekend, proceed to the local emergency room.     ED Prescriptions    Medication Sig Dispense Auth. Provider   ondansetron (ZOFRAN ODT) 4 MG disintegrating tablet Take one tab by mouth Q6hr prn nausea 12 tablet Lattie Haw, MD   cyclobenzaprine (FLEXERIL) 5 MG tablet Take one tab PO BID to TID prn muscle aches 15 tablet Lattie Haw, MD        Lattie Haw, MD 12/14/20 [phone]

## 2020-12-14 LAB — NOVEL CORONAVIRUS, NAA: SARS-CoV-2, NAA: NOT DETECTED

## 2020-12-14 LAB — SARS-COV-2, NAA 2 DAY TAT

## 2020-12-15 LAB — SARS-COV-2 RNA,(COVID-19) QUALITATIVE NAAT

## 2020-12-28 ENCOUNTER — Emergency Department (INDEPENDENT_AMBULATORY_CARE_PROVIDER_SITE_OTHER): Payer: No Typology Code available for payment source

## 2020-12-28 ENCOUNTER — Other Ambulatory Visit: Payer: Self-pay

## 2020-12-28 ENCOUNTER — Emergency Department
Admission: EM | Admit: 2020-12-28 | Discharge: 2020-12-28 | Disposition: A | Discharge status: Home / Self Care | Payer: No Typology Code available for payment source | Source: Home / Self Care

## 2020-12-28 DIAGNOSIS — M7989 Other specified soft tissue disorders: Secondary | ICD-10-CM | POA: Diagnosis not present

## 2020-12-28 DIAGNOSIS — M79675 Pain in left toe(s): Secondary | ICD-10-CM | POA: Diagnosis not present

## 2020-12-28 DIAGNOSIS — M869 Osteomyelitis, unspecified: Secondary | ICD-10-CM | POA: Diagnosis not present

## 2020-12-28 NOTE — ED Provider Notes (Signed)
[name]    CSN: [id number] Arrival date & time: 12/28/20  0804      History   Chief Complaint Chief Complaint  Patient presents with   Toe Pain    2nd toe of L foot    HPI Melissa Barnes is a 30 y.o. female who presents with swelling, redness and pain of her L 2nd toe x 2 days. Denies injuring her self. Has been taking Aleve and icing it without relief. States she leaves her shoes in the garage and she worked all day the night she noticed the redness and pain, but was not severe. She worked again all day yesterday and could feel the discomfort on this toe. When she got home and took her shoe off, was even more swollen. It woke her up at 4 am with pain. She is not a sleep walker to have injured it in her sleep.     Past Medical History:  Diagnosis Date   Anemia    DDD (degenerative disc disease)    Unspecified vitamin D deficiency     Patient Active Problem List   Diagnosis Date Noted   Unspecified vitamin D deficiency 06/02/2013   DDD (degenerative disc disease) 06/02/2013   Other B-complex deficiencies 06/02/2013    Past Surgical History:  Procedure Laterality Date   CHOLECYSTECTOMY N/A 03/04/2018   Procedure: LAPAROSCOPIC CHOLECYSTECTOMY WITH INTRAOPERATIVE CHOLANGIOGRAM ERAS PATHWAY;  Surgeon: Griselda Miner, MD;  Location: [facility] OR;  Service: General;  Laterality: N/A;   CYST REMOVAL TRUNK Right 2009   chest wall   LUMBAR LAMINECTOMY/DECOMPRESSION MICRODISCECTOMY Right 11/27/2012   Procedure: LUMBAR LAMINECTOMY/DECOMPRESSION MICRODISCECTOMY;  Surgeon: Emilee Hero, MD;  Location: [facility] OR;  Service: Orthopedics;  Laterality: Right;  Right-sided lumbar 5-sacral 1 microdisectomy   WISDOM TOOTH EXTRACTION      OB History   No obstetric history on file.      Home Medications    Prior to Admission medications   Medication Sig Start Date End Date Taking? Authorizing Provider  azithromycin (ZITHROMAX) 250 MG tablet 2 tabs today then 1 tab  daily x 4 more days 10/23/20   Eustace Moore, MD  cyclobenzaprine (FLEXERIL) 5 MG tablet Take one tab PO BID to TID prn muscle aches 12/13/20   Lattie Haw, MD  ondansetron (ZOFRAN ODT) 4 MG disintegrating tablet Take one tab by mouth Q6hr prn nausea 12/13/20   Lattie Haw, MD  Prenatal Vit-Fe Fumarate-FA (PRENATAL PO) Take by mouth.    [provider]  VITAMIN D PO Take by mouth.    [provider]  norgestimate-ethinyl estradiol (ORTHO-CYCLEN,SPRINTEC,PREVIFEM) 0.25-35 MG-MCG tablet Take 1 tablet by mouth daily.  10/23/20  [provider]    Family History Family History  Problem Relation Age of Onset   Healthy Mother    Hypertension Father    Cancer Paternal Grandmother        ovarian    Social History Social History   Tobacco Use   Smoking status: Never   Smokeless tobacco: Never  Vaping Use   Vaping Use: Never used  Substance Use Topics   Alcohol use: Not Currently   Drug use: No     Allergies   Minocycline, Penicillins, Vicodin [hydrocodone-acetaminophen], and Codeine   Review of Systems Review of Systems  Constitutional:  Negative for fever.  Musculoskeletal:  Negative for gait problem.  Skin:  Positive for color change. Negative for pallor, rash and wound.    Physical Exam Triage  Vital Signs ED Triage Vitals  Enc Vitals Group     BP 12/28/20 0822 99/68     Pulse Rate 12/28/20 0822 78     Resp 12/28/20 0822 20     Temp 12/28/20 0822 98.5 F (36.9 C)     Temp Source 12/28/20 0822 Oral     SpO2 12/28/20 0822 97 %     Weight 12/28/20 0817 194 lb (88 kg)     Height 12/28/20 0817 5\' 7"  (1.702 m)     Head Circumference --      Peak Flow --      Pain Score 12/28/20 0817 6     Pain Loc --      Pain Edu? --      Excl. in GC? --    No data found.  Updated Vital Signs BP 99/68 (BP Location: Right Arm)   Pulse 78   Temp 98.5 F (36.9 C) (Oral)   Resp 20   Ht 5\' 7"  (1.702 m)   Wt 194 lb (88 kg)   LMP 12/15/2020  (Approximate)   SpO2 97%   BMI 30.38 kg/m   Visual Acuity Right Eye Distance:   Left Eye Distance:   Bilateral Distance:    Right Eye Near:   Left Eye Near:    Bilateral Near:     Physical Exam Vitals and nursing note reviewed.  Constitutional:      General: She is not in acute distress.    Appearance: She is not toxic-appearing.  HENT:     Right Ear: External ear normal.     Left Ear: External ear normal.  Eyes:     General: No scleral icterus.    Conjunctiva/sclera: Conjunctivae normal.  Cardiovascular:     Pulses: Normal pulses.  Pulmonary:     Effort: Pulmonary effort is normal.  Musculoskeletal:     Cervical back: Neck supple.     Comments: L FOOT- L second toe is swollen, erythematous but mildly warm, and very tender on the bone from mid joint region to distal toe area. Has decreased ROM due to pain. No streaking present or wounds between toes.   Skin:    General: Skin is warm and dry.     Capillary Refill: Capillary refill takes less than 2 seconds.  Neurological:     Mental Status: She is alert and oriented to person, place, and time.     Gait: Gait normal.  Psychiatric:        Mood and Affect: Mood normal.        Behavior: Behavior normal.        Thought Content: Thought content normal.        Judgment: Judgment normal.     UC Treatments / Results  Labs (all labs ordered are listed, but only abnormal results are displayed) Labs Reviewed - No data to display  EKG   Radiology DG Toe 2nd Left  Result Date: 12/28/2020 CLINICAL DATA:  30 year old female with redness and swelling for 2 days with no known injury. EXAM: LEFT SECOND TOE COMPARISON:  None. FINDINGS: First through 3rd phalanges are visible on most of these images. The 2nd toe distal phalanx tuft demonstrates cortical irregularity unlike the other distal phalanges. And furthermore there is suspicion of early cortical osteolysis (images 1 and 4). No soft tissue gas. No dislocation. Joint spaces  and other phalanges appear normal. IMPRESSION: Cortical irregularity and suggestion of early osteolysis at the tuft of the 2nd distal phalanx suspicious for  Osteomyelitis. Query leukocytosis. Sequelae of trauma is felt less likely. Electronically Signed   By: Odessa Fleming M.D.   On: 12/28/2020 09:14    Procedures Procedures (including critical care time)  Medications Ordered in UC Medications - No data to display  Initial Impression / Assessment and Plan / UC Course  I have reviewed the triage vital signs and the nursing notes. Pertinent imaging results that were available during my care of the patient were reviewed by me and considered in my medical decision making (see chart for details). Pt's xray of L 2nd toe shows changes consistent with osteomyelitis and I sent her to ER for further treatment.   Final Clinical Impressions(s) / UC Diagnoses   Final diagnoses:  Toe pain, left  Osteomyelitis of toe of left foot [facility])     Discharge Instructions      Please go to ER to get further imaging and confirmation of what your xray is showing  Your xray report shows the following:  CLINICAL DATA:  30 year old female with redness and swelling for 2 days with no known injury.   EXAM: LEFT SECOND TOE   COMPARISON:  None.   FINDINGS: First through 3rd phalanges are visible on most of these images. The 2nd toe distal phalanx tuft demonstrates cortical irregularity unlike the other distal phalanges. And furthermore there is suspicion of early cortical osteolysis (images 1 and 4).   No soft tissue gas. No dislocation. Joint spaces and other phalanges appear normal.   IMPRESSION: Cortical irregularity and suggestion of early osteolysis at the tuft of the 2nd distal phalanx suspicious for Osteomyelitis. Query leukocytosis. Sequelae of trauma is felt less likely.     Electronically Signed   By: Odessa Fleming M.D.   On: 12/28/2020 09:14     ED Prescriptions   None    PDMP not reviewed  this encounter.   Garey Ham, [place] 12/28/20 0935

## 2020-12-28 NOTE — ED Triage Notes (Signed)
Pt presents to Urgent Care with c/o pain, redness, and swelling to 2nd toe of L foot x 2 days. No known cause. Has been taking Aleve and using ice w/o relief.

## 2020-12-28 NOTE — Discharge Instructions (Addendum)
Please go to ER to get further imaging and confirmation of what your xray is showing  Your xray report shows the following:  CLINICAL DATA:  30 year old female with redness and swelling for 2 days with no known injury.   EXAM: LEFT SECOND TOE   COMPARISON:  None.   FINDINGS: First through 3rd phalanges are visible on most of these images. The 2nd toe distal phalanx tuft demonstrates cortical irregularity unlike the other distal phalanges. And furthermore there is suspicion of early cortical osteolysis (images 1 and 4).   No soft tissue gas. No dislocation. Joint spaces and other phalanges appear normal.   IMPRESSION: Cortical irregularity and suggestion of early osteolysis at the tuft of the 2nd distal phalanx suspicious for Osteomyelitis. Query leukocytosis. Sequelae of trauma is felt less likely.     Electronically Signed   By: Odessa Fleming M.D.   On: 12/28/2020 09:14

## 2020-12-29 ENCOUNTER — Other Ambulatory Visit (HOSPITAL_COMMUNITY): Payer: Self-pay

## 2020-12-29 MED ORDER — COLCHICINE 0.6 MG PO TABS
0.6000 mg | ORAL_TABLET | Freq: Every day | ORAL | 1 refills | Status: DC
  Filled 2020-12-29: qty 30, 30d supply, fill #0

## 2021-03-15 ENCOUNTER — Emergency Department (HOSPITAL_COMMUNITY): Payer: No Typology Code available for payment source

## 2021-03-15 ENCOUNTER — Other Ambulatory Visit: Payer: Self-pay

## 2021-03-15 ENCOUNTER — Emergency Department (HOSPITAL_COMMUNITY)
Admission: EM | Admit: 2021-03-15 | Discharge: 2021-03-15 | Disposition: A | Discharge status: Home / Self Care | Payer: No Typology Code available for payment source | Attending: Emergency Medicine | Admitting: Emergency Medicine

## 2021-03-15 ENCOUNTER — Encounter (HOSPITAL_COMMUNITY): Payer: Self-pay | Admitting: Student

## 2021-03-15 DIAGNOSIS — R202 Paresthesia of skin: Secondary | ICD-10-CM | POA: Diagnosis not present

## 2021-03-15 LAB — CBC WITH DIFFERENTIAL/PLATELET
Abs Immature Granulocytes: 0.02 10*3/uL (ref 0.00–0.07)
Basophils Absolute: 0 10*3/uL (ref 0.0–0.1)
Basophils Relative: 1 %
Eosinophils Absolute: 0.1 10*3/uL (ref 0.0–0.5)
Eosinophils Relative: 1 %
HCT: 41.2 % (ref 36.0–46.0)
Hemoglobin: 13.9 g/dL (ref 12.0–15.0)
Immature Granulocytes: 0 %
Lymphocytes Relative: 28 %
Lymphs Abs: 1.6 10*3/uL (ref 0.7–4.0)
MCH: 30.4 pg (ref 26.0–34.0)
MCHC: 33.7 g/dL (ref 30.0–36.0)
MCV: 90.2 fL (ref 80.0–100.0)
Monocytes Absolute: 0.3 10*3/uL (ref 0.1–1.0)
Monocytes Relative: 5 %
Neutro Abs: 3.7 10*3/uL (ref 1.7–7.7)
Neutrophils Relative %: 65 %
Platelets: 275 10*3/uL (ref 150–400)
RBC: 4.57 MIL/uL (ref 3.87–5.11)
RDW: 12.3 % (ref 11.5–15.5)
WBC: 5.8 10*3/uL (ref 4.0–10.5)
nRBC: 0 % (ref 0.0–0.2)

## 2021-03-15 LAB — I-STAT BETA HCG BLOOD, ED (MC, WL, AP ONLY): I-stat hCG, quantitative: <5 m[IU]/mL (ref <5)

## 2021-03-15 LAB — COMPREHENSIVE METABOLIC PANEL
ALT: 16 U/L (ref 0–44)
AST: 28 U/L (ref 15–41)
Albumin: 4 g/dL (ref 3.5–5.0)
Alkaline Phosphatase: 58 U/L (ref 38–126)
Anion gap: 6 (ref 5–15)
BUN: 11 mg/dL (ref 6–20)
CO2: 25 mmol/L (ref 22–32)
Calcium: 8.5 mg/dL — ABNORMAL LOW (ref 8.9–10.3)
Chloride: 106 mmol/L (ref 98–111)
Creatinine, Ser: 0.63 mg/dL (ref 0.44–1.00)
GFR, Estimated: >60 mL/min (ref >60)
Glucose, Bld: 96 mg/dL (ref 70–99)
Potassium: 3.7 mmol/L (ref 3.5–5.1)
Sodium: 137 mmol/L (ref 135–145)
Total Bilirubin: 0.6 mg/dL (ref 0.3–1.2)
Total Protein: 7.2 g/dL (ref 6.5–8.1)

## 2021-03-15 LAB — URINALYSIS, ROUTINE W REFLEX MICROSCOPIC
Bilirubin Urine: NEGATIVE
Glucose, UA: NEGATIVE mg/dL
Hgb urine dipstick: NEGATIVE
Ketones, ur: NEGATIVE mg/dL
Leukocytes,Ua: NEGATIVE
Nitrite: NEGATIVE
Protein, ur: NEGATIVE mg/dL
Specific Gravity, Urine: 1.009 (ref 1.005–1.030)
pH: 7 (ref 5.0–8.0)

## 2021-03-15 LAB — VITAMIN B12: Vitamin B-12: 223 pg/mL (ref 180–914)

## 2021-03-15 LAB — MAGNESIUM: Magnesium: 2.1 mg/dL (ref 1.7–2.4)

## 2021-03-15 LAB — FOLATE: Folate: 20.5 ng/mL (ref >5.9)

## 2021-03-15 LAB — TSH: TSH: 2.679 u[IU]/mL (ref 0.350–4.500)

## 2021-03-15 LAB — PHOSPHORUS: Phosphorus: 3.1 mg/dL (ref 2.5–4.6)

## 2021-03-15 MED ORDER — CYANOCOBALAMIN 1000 MCG/ML IJ SOLN
1000.0000 ug | Freq: Once | INTRAMUSCULAR | Status: AC
  Administered 2021-03-15: 1000 ug via INTRAMUSCULAR
  Filled 2021-03-15: qty 1

## 2021-03-15 MED ORDER — GADOBUTROL 1 MMOL/ML IV SOLN
9.0000 mL | Freq: Once | INTRAVENOUS | Status: AC | PRN
  Administered 2021-03-15: 9 mL via INTRAVENOUS

## 2021-03-15 NOTE — ED Notes (Signed)
DC instructions reviewed with pt. PT verbalized understanding. PT DC °

## 2021-03-15 NOTE — ED Provider Notes (Signed)
Care assumed from [facility], PA-C at shift change pending neurology recommendations. See her note for full HPI.  In short, patient is a 30 year old female who presents to the ED due to paresthesias x1 week.  Patient admits to bilateral toe/foot and seizures that has been constant for the past week.  Patient also endorses subjective decrease sensation to bilateral lower extremities.  No history of MS.  Patient seen by PCP and was referred to the ED for possible MRI imaging.  Patient denies low back pain, urinary/bowel incontinence, fever/chills. No IV drug use.   Plan from previous provider: awaiting neurology recommendations, most likely will need images.   ED Course/Procedures    Results for orders placed or performed during the hospital encounter of 03/15/21 (from the past 24 hour(s))  Urinalysis, Routine w reflex microscopic     Status: Abnormal   Collection Time: 03/15/21 10:53 AM  Result Value Ref Range   Color, Urine STRAW (A) YELLOW   APPearance CLEAR CLEAR   Specific Gravity, Urine 1.009 1.005 - 1.030   pH 7.0 5.0 - 8.0   Glucose, UA NEGATIVE NEGATIVE mg/dL   Hgb urine dipstick NEGATIVE NEGATIVE   Bilirubin Urine NEGATIVE NEGATIVE   Ketones, ur NEGATIVE NEGATIVE mg/dL   Protein, ur NEGATIVE NEGATIVE mg/dL   Nitrite NEGATIVE NEGATIVE   Leukocytes,Ua NEGATIVE NEGATIVE  CBC with Differential     Status: None   Collection Time: 03/15/21 11:05 AM  Result Value Ref Range   WBC 5.8 4.0 - 10.5 K/uL   RBC 4.57 3.87 - 5.11 MIL/uL   Hemoglobin 13.9 12.0 - 15.0 g/dL   HCT 45.8 48.3 - 50.7 %   MCV 90.2 80.0 - 100.0 fL   MCH 30.4 26.0 - 34.0 pg   MCHC 33.7 30.0 - 36.0 g/dL   RDW 57.3 22.5 - 67.2 %   Platelets 275 150 - 400 K/uL   nRBC 0.0 0.0 - 0.2 %   Neutrophils Relative % 65 %   Neutro Abs 3.7 1.7 - 7.7 K/uL   Lymphocytes Relative 28 %   Lymphs Abs 1.6 0.7 - 4.0 K/uL   Monocytes Relative 5 %   Monocytes Absolute 0.3 0.1 - 1.0 K/uL   Eosinophils Relative 1 %    Eosinophils Absolute 0.1 0.0 - 0.5 K/uL   Basophils Relative 1 %   Basophils Absolute 0.0 0.0 - 0.1 K/uL   Immature Granulocytes 0 %   Abs Immature Granulocytes 0.02 0.00 - 0.07 K/uL  Comprehensive metabolic panel     Status: Abnormal   Collection Time: 03/15/21 11:05 AM  Result Value Ref Range   Sodium 137 135 - 145 mmol/L   Potassium 3.7 3.5 - 5.1 mmol/L   Chloride 106 98 - 111 mmol/L   CO2 25 22 - 32 mmol/L   Glucose, Bld 96 70 - 99 mg/dL   BUN 11 6 - 20 mg/dL   Creatinine, Ser 0.91 0.44 - 1.00 mg/dL   Calcium 8.5 (L) 8.9 - 10.3 mg/dL   Total Protein 7.2 6.5 - 8.1 g/dL   Albumin 4.0 3.5 - 5.0 g/dL   AST 28 15 - 41 U/L   ALT 16 0 - 44 U/L   Alkaline Phosphatase 58 38 - 126 U/L   Total Bilirubin 0.6 0.3 - 1.2 mg/dL   GFR, Estimated >98 >02 mL/min   Anion gap 6 5 - 15  Magnesium     Status: None   Collection Time: 03/15/21 11:05 AM  Result Value Ref Range  Magnesium 2.1 1.7 - 2.4 mg/dL  Phosphorus     Status: None   Collection Time: 03/15/21 11:05 AM  Result Value Ref Range   Phosphorus 3.1 2.5 - 4.6 mg/dL  TSH     Status: None   Collection Time: 03/15/21 11:05 AM  Result Value Ref Range   TSH 2.679 0.350 - 4.500 uIU/mL  POC beta hCG blood, ED     Status: None   Collection Time: 03/15/21 11:11 AM  Result Value Ref Range   I-stat hCG, quantitative <5.0 <5 mIU/mL   Comment 3            Procedures  MDM  Care assumed from [facility], PA-C at shift change pending neurology recommendations. See her note for full MDM.  30 year old female presents to the ED due to bilateral lower extremity paresthesias in addition to vaginal paresthesias for the past week.  No history of MS.  No low back pain. No urinary incontinence. Upon arrival, vitals all within normal limits.  Patient is afebrile, not tachycardic or hypoxic. I have personally reviewed all labs ordered by previous provider.  CBC unremarkable no leukocytosis and normal hemoglobin.  CMP reassuring with normal renal  function no major electrolyte derangements.  Normal magnesium and phosphorus.  Pregnancy test negative.  TSH normal.  Awaiting B12 results.  UA negative for signs of infection.  Discussed case with neurosurgery who recommends consulting orthopedics because Dr. Yevette Edwards performed her lumbar surgery in 2014. Discussed case with Dr. Luiz Blare who notes that if no significant abnormalities are found on the MRI, patient can follow-up in the office tomorrow.  MRI lumbar spine personally reviewed which demonstrates: IMPRESSION:  1. Postsurgical changes reflecting right laminotomy at L5-S1  consistent with the provided history of microdiscectomy.  2. Mild right subarticular zone disc protrusion at L5-S1 which  contacts the traversing S1 nerve root without definite evidence of  impingement. Mild bilateral neural foraminal stenosis at this level  without evidence of impingement of the exiting nerve roots.  3. Otherwise, no significant spinal canal or neural foraminal  stenosis in the lumbar spine.   B12 normal.  Discussed with Dr. Derry Lory with neurology who recommends MRI brain, cervical spine, and thoracic spine. Unfortunately, informed by radiology that patient will be unable to receive MRI for 12 hours due to previous contrast. Discussed with Dr. Derry Lory who recommends holding patient in the ED until scans performed. Discussed plan with patient who notes she does not want to stay for scans. I had a long discussion with patient about the risks of leaving without the scans. Dr. Derry Lory also discussed with patient at bedside. Patient given B12 injection. Folate pending. Patient discharged with outpatient neurology referral. Strict ED precautions discussed with patient. Patient states understanding and agrees to plan. Patient discharged home in no acute distress and stable vitals    Jesusita Oka 03/15/21 2239    Tegeler, Canary Brim, MD 03/16/21 [phone]

## 2021-03-15 NOTE — Discharge Instructions (Addendum)
It was a pleasure taking care of you today.  As discussed, your MRI of your low back did not show any significant nerve impingement.  You left prior to your brain and further spine MRIs.  You were given an injection of B12.  I have placed a referral for neurology.  Expect a phone call within the next week.  If you do not hear from them within 1 week please call with the number provided to schedule an appointment.  Return to the ER for any new or worsening symptoms.

## 2021-03-15 NOTE — Consult Note (Signed)
Neurology Consultation  Reason for Consult: paresthesias. Referring Physician: S. Petrucelli, PA.   CC: numbness.   History is obtained from: Patient, ED PA.   HPI: Melissa Barnes is a 30 y.o. female with PMHx of DDD, Vitamin D deficiency, and anemia. 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 Melissa Barnes presented today at the advice of her PCP for numbness and tingling. 6 days ago, she began to have tingling in her feet followed by saddle anesthesia, but was able to go on a "girls" trip to [place] and walk a lot without problems. Today, she felt the numbness in her bilateral anterior thighs and a sensation of numbness to her tibia when she shaved her legs today. The sensory changes have not occurred in an ascending fashion.   She describes the numbness as a decreased sensation, not that she can not feel. Over the past 6 days, the numbness has become more apparent in her feet, stating that sometimes she can not feel them. She denies any clumsiness, ataxia, falls, or inability to ambulate. She drove her car today. She denies any bowel/bladder incontinence. States she can feel toilet tissue when she urinates/wipes, but sensation is very decreased. It is the same with rectal area. No bowel or bladder incontinence. She is aware of when she needs to void or have a BM, can feel when she is voiding/BM, and is aware of when she is finished. Her mons pubis is numb. She has not had intercourse since this started.   She had a twinge of "sciatica" type pain on the sofa the other night which subsided quickly. No pain to extremities.   She denies dysphagia, dysarthria, vision loss, blurred or double vision, pain anywhere, weakness, burning sensation, ascending weakness or ascending numbness/tingling. She denies recent infection, unsteady gait.   Neurology asked to see patient for numbness and tingling.   ROS: A robust ROS was performed and is negative except as noted in the HPI.   Past Medical History:  Diagnosis Date    Anemia    DDD (degenerative disc disease)    Unspecified vitamin D deficiency    Family History  Problem Relation Age of Onset   Healthy Mother    Hypertension Father    Cancer Paternal Grandmother        ovarian   Social History:   reports that she has never smoked. She has never used smokeless tobacco. She reports that she does not currently use alcohol. She reports that she does not use drugs.  Medications No current facility-administered medications for this encounter.  Current Outpatient Medications:    colchicine 0.6 MG tablet, Take 1 tablet (0.6 mg total) by mouth daily. Take 2 tablets when symptoms start then 1 tablet 1 hour after. Then take 1 tablet twice a day until symptoms are gone. (Patient taking differently: Take 0.6-1.2 mg by mouth daily as needed (gout pain).), Disp: 30 tablet, Rfl: 1   cyclobenzaprine (FLEXERIL) 5 MG tablet, Take one tab PO BID to TID prn muscle aches, Disp: 15 tablet, Rfl: 1   naproxen sodium (ALEVE) 220 MG tablet, Take 440 mg by mouth daily as needed (pain)., Disp: , Rfl:    Prenatal Vit-Fe Fumarate-FA (PRENATAL PO), Take 1 tablet by mouth daily., Disp: , Rfl:    azithromycin (ZITHROMAX) 250 MG tablet, 2 tabs today then 1 tab daily x 4 more days (Patient not taking: No sig reported), Disp: 6 tablet, Rfl: 0   ondansetron (ZOFRAN ODT) 4 MG disintegrating tablet, Take one tab  by mouth Q6hr prn nausea (Patient not taking: No sig reported), Disp: 12 tablet, Rfl: 0   Exam: Current vital signs: BP 112/82 (BP Location: Right Arm)   Pulse 83   Temp 98.4 F (36.9 C) (Oral)   Resp 20   Ht 5\' 7"  (1.702 m)   Wt 92.5 kg   SpO2 96%   BMI 31.95 kg/m  Vital signs in last 24 hours: Temp:  [98.4 F (36.9 C)] 98.4 F (36.9 C) (08/30 1041) Pulse Rate:  [80-83] 83 (08/30 1416) Resp:  [18-20] 20 (08/30 1416) BP: (112-132)/(80-90) 112/82 (08/30 1416) SpO2:  [96 %-100 %] 96 % (08/30 1416) Weight:  [92.5 kg] 92.5 kg (08/30 1100)  PE: GENERAL: Well  appearing young female lying in bed comfortably.   HEENT: normocephalic and atraumatic. LUNGS - Normal respiratory effort.  CV - RRR on tele. ABDOMEN - Soft, nontender. Ext: warm, well perfused. Psych: affect concerned, tearful.   NEURO:  Alert and oriented x 4. Follows commands.  Speech/Language: speech is without dysarthria or aphasia.  Naming, repetition, fluency, and comprehension intact.  Cranial Nerves:  II: PERRL 4 mm/brisk. visual fields full.  III, IV, VI: EOMI. Lid elevation symmetric and full.  V: sensation is intact and symmetrical to face.  VII: Smile is symmetrical. Able to puff cheeks and raise eyebrows.  VIII:hearing intact to voice. IX, X: palate elevation is symmetric. Phonation normal.  XI: normal sternocleidomastoid and trapezius muscle strength. 01-06-2001 is symmetrical without fasciculations.   Motor:  RUE: grips  5/5       triceps 5/5     biceps  5/5      LUE: grips 5 /5      triceps  5/5      biceps   /55 RLE:  knee  5/5    thigh  5/5      plantar flexion  5/5     dorsiflexion   5/5   Adduction 5/5   abduction 5/5 LLE:  knee  5/5   thigh  /55     plantar flexion   /55     dorsiflexion   5/5 Adduction 5/5  Abduction 5/5.  No nuchal rigidity. ROM complete with out pain. Lhermitte sign negative.  Tone is normal. Bulk is normal.  Sensation- Extinction absent to DSS. Sensation to light touch is intact except to the 4th and 5th right toes. To sharp, it is intact except to about 3 inches above the ankle ascending for a total of 4cm on both lower legs (like a tube wrapped around her leg). Sharp is intact from that area to mons pubis. Can not feel sharp over mons pubis up to T12. + Sharp sensation to umbilicus. Vibration: intact except to all 5 toes on right foot.  Rectal tone is intact with wink reflex.  Coordination: FTN intact bilaterally. HKS intact bilaterally. No pronator drift.  DTRs:  2+ throughout.  Gait Is able to walk to door of room and back without  issues. Gait with normal arm swing and step depth. She is able to stand on her toes, but leans to the right and left when she stands on her heels.   Labs I have reviewed labs in epic and the results pertinent to this consultation are: TSH 2.679  negative UA.   CBC    Component Value Date/Time   WBC 5.8 03/15/2021 1105   RBC 4.57 03/15/2021 1105   HGB 13.9 03/15/2021 1105   HCT 41.2 03/15/2021 1105   PLT  275 03/15/2021 1105   MCV 90.2 03/15/2021 1105   MCH 30.4 03/15/2021 1105   MCHC 33.7 03/15/2021 1105   RDW 12.3 03/15/2021 1105   LYMPHSABS 1.6 03/15/2021 1105   MONOABS 0.3 03/15/2021 1105   EOSABS 0.1 03/15/2021 1105   BASOSABS 0.0 03/15/2021 1105    CMP     Component Value Date/Time   NA 137 03/15/2021 1105   K 3.7 03/15/2021 1105   CL 106 03/15/2021 1105   CO2 25 03/15/2021 1105   GLUCOSE 96 03/15/2021 1105   BUN 11 03/15/2021 1105   CREATININE 0.63 03/15/2021 1105   CALCIUM 8.5 (L) 03/15/2021 1105   PROT 7.2 03/15/2021 1105   ALBUMIN 4.0 03/15/2021 1105   AST 28 03/15/2021 1105   ALT 16 03/15/2021 1105   ALKPHOS 58 03/15/2021 1105   BILITOT 0.6 03/15/2021 1105   GFRNONAA >60 03/15/2021 1105   GFRAA >60 02/14/2018 1945    Imaging MD reviewed the images obtained.  MRI L spine with and without contrast Postsurgical changes reflecting right laminotomy at L5-S1 consistent with the provided history of microdiscectomy. Mild right subarticular zone disc protrusion at L5-S1 which contacts the traversing S1 nerve root without definite evidence of impingement. Mild bilateral neural foraminal stenosis at this level without evidence of impingement of the exiting nerve roots. Otherwise, no significant spinal canal or neural foraminal stenosis in the lumbar spine.  Assessment: 30 yo female who presents with paraesthesias progressing over 1 week. Started out in her toes, then went to her vagina and rectal area, then mild subjective paresthesias in thighs and legs. Exam  with mildly decreased vibratory sensation in BL toes with left worse than right. She has no weakness, no clear sensory level. MRI lumbar spine not concerning for Cauda Equina syndrome.  Her presentation is strange, but I do think it is reasonable to have potential MS. She does not have a family hx of MS thou and no prior hx of autoimmune disorder. Pure Sensory GBS is rare and usually also has areflexia, presentation is also not consistent with ascending sensory deficit.  Impression: -various paraesthesias, some in L5-S1 dermatome.  -saddle anesthesia, ruled out Cauda Equina syndrome.   Recommendations/Plan:  - Vitamin B12 level are low normal from a neurological standpoint and I ordered IM Cyanocobalamin x 1 here. - Recommend PO Cyancocobalamin daily at discharge. - I also ordered folate levels. - Recommend Multivitamin daily at home. - Recommend PT at discharge. - Will get MRI Brain, C, T spine w + w/o C to rule out demyelination vs syrinx. - Follow up with D. Nita Sickle with [facility].  Plan discussed with Dr. Abundio Miu in the ED in person.  Erick Blinks [facility] Pager Number [phone]

## 2021-03-15 NOTE — ED Triage Notes (Signed)
Pt here d/t numbness of toes and legs X1 week. Decreased sensation of vaginal and rectum. Pt states touch feels dull bilaterally. Denies vision changes. No loss of bladder control. PCP recommended being seen for possible MRI

## 2021-03-15 NOTE — ED Provider Notes (Signed)
Emergency Medicine Provider Triage Evaluation Note  Melissa Barnes , a 30 y.o. female  was evaluated in triage.  Pt complains of tingling sensation.  Review of Systems  Positive: Tingling sensation to feet, thighs, vaginal region Negative: Headache, vision changes, cp, sob, rash, joint pain, fever, back pain  Physical Exam  BP 132/90 (BP Location: Right Arm)   Pulse 80   Temp 98.4 F (36.9 C) (Oral)   Resp 18   SpO2 100%  Gen:   Awake, no distress   Resp:  Normal effort  MSK:   Moves extremities without difficulty  Other:  Subjective decreased sensation to BLE, Pattela DTR 2+ bilat.   Medical Decision Making  Medically screening exam initiated at 10:53 AM.  Appropriate orders placed.  Melissa Barnes was informed that the remainder of the evaluation will be completed by another provider, this initial triage assessment does not replace that evaluation, and the importance of remaining in the ED until their evaluation is complete.  Report tingling sensation to soles of feet nearly a week, now with intermittent tingling and weakness sensation to thigh/legs as well as vaginal region. No tingling sensation to hands.  No headache, vision changes.  No recent sickness.  No pain.  No hx of MS or GBS   Fayrene Helper, PA-C 03/15/21 1056    Pollyann Savoy, MD 03/15/21 [phone]

## 2021-03-15 NOTE — ED Provider Notes (Addendum)
[facility] EMERGENCY DEPARTMENT Provider Note   CSN: [id number] Arrival date & time: 03/15/21  1037     History Chief Complaint  Patient presents with   Numbness    Melissa Barnes is a 30 y.o. female with a hx of DDD S/p microdiscectomy in 2014 and anemia who presents to the ED with complaints of paresthesias x 1 week. Patient states she has been having constant paresthesias/abnormal sensation to the toes and foot bilaterally since 08/24 and soon after she noted paresthesias to the vaginal area.  The sensation changes have been constant since onset, today she noted some intermittent similar itching sensation to the thighs and lower legs while she was shaving.  The symptoms are worse when she is doing things like shaving or wiping.  No alleviating factors.  States that her legs feel wobbly at times but not necessarily weak.  She was seen by her primary care provider who referred her to the emergency department for possible MRI imaging.  She denies fever, chills, back pain, nausea, vomiting, abdominal pain, diarrhea, chest pain, shortness of breath, cough, visual disturbance, headaches, incontinence, retention, or neck stiffness.  No recent GI illnesses.  No known family history of neurologic disorders.  HPI     Past Medical History:  Diagnosis Date   Anemia    DDD (degenerative disc disease)    Unspecified vitamin D deficiency     Patient Active Problem List   Diagnosis Date Noted   Unspecified vitamin D deficiency 06/02/2013   DDD (degenerative disc disease) 06/02/2013   Other B-complex deficiencies 06/02/2013    Past Surgical History:  Procedure Laterality Date   CHOLECYSTECTOMY N/A 03/04/2018   Procedure: LAPAROSCOPIC CHOLECYSTECTOMY WITH INTRAOPERATIVE CHOLANGIOGRAM ERAS PATHWAY;  Surgeon: Griselda Miner, MD;  Location: [facility] OR;  Service: General;  Laterality: N/A;   CYST REMOVAL TRUNK Right 2009   chest wall   LUMBAR LAMINECTOMY/DECOMPRESSION  MICRODISCECTOMY Right 11/27/2012   Procedure: LUMBAR LAMINECTOMY/DECOMPRESSION MICRODISCECTOMY;  Surgeon: Emilee Hero, MD;  Location: [facility] OR;  Service: Orthopedics;  Laterality: Right;  Right-sided lumbar 5-sacral 1 microdisectomy   WISDOM TOOTH EXTRACTION       OB History   No obstetric history on file.     Family History  Problem Relation Age of Onset   Healthy Mother    Hypertension Father    Cancer Paternal Grandmother        ovarian    Social History   Tobacco Use   Smoking status: Never   Smokeless tobacco: Never  Vaping Use   Vaping Use: Never used  Substance Use Topics   Alcohol use: Not Currently   Drug use: No    Home Medications Prior to Admission medications   Medication Sig Start Date End Date Taking? Authorizing Provider  colchicine 0.6 MG tablet Take 1 tablet (0.6 mg total) by mouth daily. Take 2 tablets when symptoms start then 1 tablet 1 hour after. Then take 1 tablet twice a day until symptoms are gone. Patient taking differently: Take 0.6-1.2 mg by mouth daily as needed (gout pain). 12/29/20  Yes   cyclobenzaprine (FLEXERIL) 5 MG tablet Take one tab PO BID to TID prn muscle aches 12/13/20  Yes Lattie Haw, MD  naproxen sodium (ALEVE) 220 MG tablet Take 440 mg by mouth daily as needed (pain).   Yes [provider]  Prenatal Vit-Fe Fumarate-FA (PRENATAL PO) Take 1 tablet by mouth daily.   Yes [provider]  azithromycin (ZITHROMAX) 250  MG tablet 2 tabs today then 1 tab daily x 4 more days Patient not taking: No sig reported 10/23/20   Eustace Moore, MD  ondansetron (ZOFRAN ODT) 4 MG disintegrating tablet Take one tab by mouth Q6hr prn nausea Patient not taking: No sig reported 12/13/20   Lattie Haw, MD  norgestimate-ethinyl estradiol (ORTHO-CYCLEN,SPRINTEC,PREVIFEM) 0.25-35 MG-MCG tablet Take 1 tablet by mouth daily.  10/23/20  [provider]    Allergies    Minocycline, Penicillins, Vicodin  [hydrocodone-acetaminophen], and Codeine  Review of Systems   Review of Systems  Constitutional:  Negative for chills and fever.  Respiratory:  Negative for cough and shortness of breath.   Cardiovascular:  Negative for chest pain.  Gastrointestinal:  Negative for abdominal pain, nausea and vomiting.  Genitourinary:  Negative for dysuria and hematuria.  Musculoskeletal:  Negative for back pain.  Neurological:  Positive for numbness. Negative for weakness.       Negative for incontinence/retention.  Positive for saddle paresthesias.   All other systems reviewed and are negative.  Physical Exam Updated Vital Signs BP 112/80 (BP Location: Left Arm)   Pulse 80   Temp 98.4 F (36.9 C) (Oral)   Resp 18   Ht 5\' 7"  (1.702 m)   Wt 92.5 kg   SpO2 99%   BMI 31.95 kg/m   Physical Exam Vitals and nursing note reviewed.  Constitutional:      General: She is not in acute distress.    Appearance: She is well-developed. She is not toxic-appearing.  HENT:     Head: Normocephalic and atraumatic.  Eyes:     General:        Right eye: No discharge.        Left eye: No discharge.     Conjunctiva/sclera: Conjunctivae normal.  Cardiovascular:     Rate and Rhythm: Normal rate and regular rhythm.  Pulmonary:     Effort: Pulmonary effort is normal. No respiratory distress.     Breath sounds: Normal breath sounds. No wheezing, rhonchi or rales.  Abdominal:     General: There is no distension.     Palpations: Abdomen is soft.     Tenderness: There is no abdominal tenderness. There is no guarding or rebound.  Musculoskeletal:     Cervical back: Neck supple.     Comments: No point/focal vertebral tenderness.  Skin:    General: Skin is warm and dry.     Findings: No rash.  Neurological:     Mental Status: She is alert.     Comments: Clear speech.  Sensation grossly intact bilateral upper extremities.  Patient reports subjective decrease sensation to the bilateral lower extremities especially  in the feet.  She has better has 5/5 grip strength, hip flexion/extension, knee flexion/extension, and ankle plantar/dorsiflexion strength.  Patellar DTRs are 2+ and symmetric.  Psychiatric:        Behavior: Behavior normal.    ED Results / Procedures / Treatments   Labs (all labs ordered are listed, but only abnormal results are displayed) Labs Reviewed  COMPREHENSIVE METABOLIC PANEL - Abnormal; Notable for the following components:      Result Value   Calcium 8.5 (*)    All other components within normal limits  URINALYSIS, ROUTINE W REFLEX MICROSCOPIC - Abnormal; Notable for the following components:   Color, Urine STRAW (*)    All other components within normal limits  CBC WITH DIFFERENTIAL/PLATELET  MAGNESIUM  PHOSPHORUS  TSH  I-STAT BETA HCG BLOOD, ED (  [facility], [facility], [facility] ONLY)    EKG None  Radiology No results found.  Procedures Procedures   Medications Ordered in ED Medications - No data to display  ED Course  I have reviewed the triage vital signs and the nursing notes.  Pertinent labs & imaging results that were available during my care of the patient were reviewed by me and considered in my medical decision making (see chart for details).    MDM Rules/Calculators/A&P                           Patient presents to the ED with complaints of paresthesias.  Nontoxic, vitals without significant abnormality.   Additional history obtained:  Additional history obtained from chart review & nursing note review.   Lab Tests:  I reviewed and interpreted labs, which included:  CBC, CMP, phosphorus, magnesium, TSH, pregnancy test: Grossly unremarkable.  No significant electrolyte derangement.  No significant thyroid dysfunction.  Renal function liver function test are preserved  ED Course:  14:10: CONSULT: Discussed with neurology NP Tereso Newcomer with neurology- will see patient in consultation and provide recommendations for further work up.   15:00: Patient care  signed out to Claudette Stapler PA-C at change of shift pending neurology assessment and recommendations.  Discussed w/ attending Dr. Karene Fry- in agreement.   Portions of this note were generated with Scientist, clinical (histocompatibility and immunogenetics). Dictation errors may occur despite best attempts at proofreading.  Final Clinical Impression(s) / ED Diagnoses Final diagnoses:  None    Rx / DC Orders ED Discharge Orders     None        Desmond Lope 03/15/21 1533  16:20: CONSULT: Re-discussed with neurology NP who has discussed with Dr. Selina Cooley - recommend MRI L spine wwo to evaluate for cauda equina syndrome, they evaluated rectal tone and relay this is intact.   MRI ordered.   16:23: Called MRI to facilitate MRI ASAP.     Desmond Lope 03/15/21 1623  16:35: CONSULT: Re-discussed with neurology- Dr. Darlyn Read & NP Kirby-Graham recommend holding off on Decadron and discussing with neurosurgery. Consult placed. Please see oncoming ED team's notes for further management.     Desmond Lope 03/15/21 1640    Ernie Avena, MD 03/15/21 2113

## 2021-03-15 NOTE — ED Notes (Signed)
Pt transported to MRI 

## 2021-03-22 ENCOUNTER — Encounter: Payer: Self-pay | Admitting: Neurology

## 2021-04-21 ENCOUNTER — Ambulatory Visit: Payer: No Typology Code available for payment source | Admitting: Neurology

## 2021-06-06 ENCOUNTER — Ambulatory Visit: Payer: No Typology Code available for payment source | Admitting: Neurology

## 2021-06-22 ENCOUNTER — Emergency Department (INDEPENDENT_AMBULATORY_CARE_PROVIDER_SITE_OTHER)
Admission: EM | Admit: 2021-06-22 | Discharge: 2021-06-22 | Disposition: A | Discharge status: Home / Self Care | Payer: No Typology Code available for payment source | Source: Home / Self Care

## 2021-06-22 ENCOUNTER — Other Ambulatory Visit: Payer: Self-pay

## 2021-06-22 DIAGNOSIS — J029 Acute pharyngitis, unspecified: Secondary | ICD-10-CM | POA: Diagnosis not present

## 2021-06-22 DIAGNOSIS — J069 Acute upper respiratory infection, unspecified: Secondary | ICD-10-CM

## 2021-06-22 LAB — POC SARS CORONAVIRUS 2 AG -  ED: SARS Coronavirus 2 Ag: NEGATIVE

## 2021-06-22 LAB — POCT RAPID STREP A (OFFICE): Rapid Strep A Screen: NEGATIVE

## 2021-06-22 LAB — POCT INFLUENZA A/B
Influenza A, POC: NEGATIVE
Influenza B, POC: NEGATIVE

## 2021-06-22 MED ORDER — LIDOCAINE VISCOUS HCL 2 % MT SOLN: 15.0000 mL | OROMUCOSAL | 0 refills | Status: DC | PRN

## 2021-06-22 NOTE — ED Provider Notes (Signed)
[name]    CSN: [id number] Arrival date & time: 06/22/21  3220      History   Chief Complaint Chief Complaint  Patient presents with   Sore Throat   Nasal Congestion    HPI Melissa Barnes is a 30 y.o. female.   Patient presents with concerns of feeling unwell since Sunday. She reports sore throat, nasal congestion, rhinorrhea, and sneezing. She has had fatigue but is [redacted] weeks pregnant. She denies fever, headache, body aches, n/v/d. She reports she was with her mom this weekend who has also developed cold symptoms, and she states a coworker recently had strep and the patient is prone to strep. The patient has just been using cough drops for her sore throat.   The history is provided by the patient.  Sore Throat Pertinent negatives include no chest pain, no headaches and no shortness of breath.   Past Medical History:  Diagnosis Date   Anemia    DDD (degenerative disc disease)    Unspecified vitamin D deficiency     Patient Active Problem List   Diagnosis Date Noted   Unspecified vitamin D deficiency 06/02/2013   DDD (degenerative disc disease) 06/02/2013   Other B-complex deficiencies 06/02/2013    Past Surgical History:  Procedure Laterality Date   CHOLECYSTECTOMY N/A 03/04/2018   Procedure: LAPAROSCOPIC CHOLECYSTECTOMY WITH INTRAOPERATIVE CHOLANGIOGRAM ERAS PATHWAY;  Surgeon: Griselda Miner, MD;  Location: [facility] OR;  Service: General;  Laterality: N/A;   CYST REMOVAL TRUNK Right 2009   chest wall   LUMBAR LAMINECTOMY/DECOMPRESSION MICRODISCECTOMY Right 11/27/2012   Procedure: LUMBAR LAMINECTOMY/DECOMPRESSION MICRODISCECTOMY;  Surgeon: Emilee Hero, MD;  Location: [facility] OR;  Service: Orthopedics;  Laterality: Right;  Right-sided lumbar 5-sacral 1 microdisectomy   WISDOM TOOTH EXTRACTION      OB History     Gravida  1   Para      Term      Preterm      AB      Living         SAB      IAB      Ectopic      Multiple      Live  Births               Home Medications    Prior to Admission medications   Medication Sig Start Date End Date Taking? Authorizing Provider  cholecalciferol (VITAMIN D3) 25 MCG (1000 UNIT) tablet Take 1,000 Units by mouth daily.   Yes [provider]  lidocaine (XYLOCAINE) 2 % solution Use as directed 15 mLs in the mouth or throat as needed. 06/22/21  Yes Noach Calvillo L, PA  Prenatal Vit-Fe Fumarate-FA (PRENATAL PO) Take 1 tablet by mouth daily.    [provider]  norgestimate-ethinyl estradiol (ORTHO-CYCLEN,SPRINTEC,PREVIFEM) 0.25-35 MG-MCG tablet Take 1 tablet by mouth daily.  10/23/20  [provider]    Family History Family History  Problem Relation Age of Onset   Healthy Mother    Hypertension Father    Cancer Paternal Grandmother        ovarian    Social History Social History   Tobacco Use   Smoking status: Never   Smokeless tobacco: Never  Vaping Use   Vaping Use: Never used  Substance Use Topics   Alcohol use: Not Currently   Drug use: No     Allergies   Minocycline, Penicillins, Vicodin [hydrocodone-acetaminophen], and Codeine   Review of Systems Review of Systems  Constitutional:  Positive for fatigue. Negative for fever.  HENT:  Positive for congestion, rhinorrhea, sneezing and sore throat. Negative for ear pain and trouble swallowing.   Eyes:  Negative for redness.  Respiratory:  Negative for cough and shortness of breath.   Cardiovascular:  Negative for chest pain.  Gastrointestinal:  Negative for diarrhea, nausea and vomiting.  Musculoskeletal:  Negative for myalgias.  Skin:  Negative for rash.  Neurological:  Negative for dizziness and headaches.    Physical Exam Triage Vital Signs ED Triage Vitals  Enc Vitals Group     BP 06/22/21 0909 103/70     Pulse Rate 06/22/21 0909 89     Resp 06/22/21 0909 20     Temp 06/22/21 0909 98.4 F (36.9 C)     Temp Source 06/22/21 0909 Oral     SpO2 06/22/21 0909 99 %      Weight 06/22/21 0903 196 lb (88.9 kg)     Height 06/22/21 0903 5\' 7"  (1.702 m)     Head Circumference --      Peak Flow --      Pain Score 06/22/21 0902 6     Pain Loc --      Pain Edu? --      Excl. in GC? --    No data found.  Updated Vital Signs BP 103/70   Pulse 89   Temp 98.4 F (36.9 C) (Oral)   Resp 20   Ht 5\' 7"  (1.702 m)   Wt 196 lb (88.9 kg)   LMP 12/15/2020 (Approximate)   SpO2 99%   BMI 30.70 kg/m   Visual Acuity Right Eye Distance:   Left Eye Distance:   Bilateral Distance:    Right Eye Near:   Left Eye Near:    Bilateral Near:     Physical Exam Vitals and nursing note reviewed.  Constitutional:      General: She is not in acute distress. HENT:     Head: Normocephalic.     Nose: Congestion present. No rhinorrhea.     Mouth/Throat:     Mouth: Mucous membranes are moist.     Pharynx: Oropharynx is clear. Posterior oropharyngeal erythema present. No oropharyngeal exudate.  Eyes:     Conjunctiva/sclera: Conjunctivae normal.     Pupils: Pupils are equal, round, and reactive to light.  Cardiovascular:     Rate and Rhythm: Normal rate and regular rhythm.     Heart sounds: Normal heart sounds.  Pulmonary:     Effort: Pulmonary effort is normal.     Breath sounds: Normal breath sounds.  Musculoskeletal:     Cervical back: Normal range of motion.  Lymphadenopathy:     Cervical: Cervical adenopathy present.  Skin:    Findings: No rash.  Neurological:     Mental Status: She is alert.     Gait: Gait normal.  Psychiatric:        Mood and Affect: Mood normal.     UC Treatments / Results  Labs (all labs ordered are listed, but only abnormal results are displayed) Labs Reviewed  CULTURE, GROUP A STREP  POCT RAPID STREP A (OFFICE)  POCT INFLUENZA A/B  POC SARS CORONAVIRUS 2 AG -  ED    EKG   Radiology No results found.  Procedures Procedures (including critical care time)  Medications Ordered in UC Medications - No data to  display  Initial Impression / Assessment and Plan / UC Course  I have reviewed the triage vital signs  and the nursing notes.  Pertinent labs & imaging results that were available during my care of the patient were reviewed by me and considered in my medical decision making (see chart for details).     Rapid strep, COVID, and flu neg. Strep cx to confirm. Sx tx and reassurance.   E/M: 1 acute uncomplicated illness, 4 data (strep, COVID, flu, strep cx), moderate risk due to prescription management  Final Clinical Impressions(s) / UC Diagnoses   Final diagnoses:  Viral upper respiratory tract infection  Acute pharyngitis, unspecified etiology     Discharge Instructions      Strep, COVID, and flu tests negative today. Sending out strep culture to confirm and will contact you with results. Rest, keep hydrated, and continue with OTC medicine. Use numbing mouthwash as prescribed as needed. Follow-up with PCP or OB if no improvement in a week. Go to the ER if develop inability to swallow or difficulty breathing.      ED Prescriptions     Medication Sig Dispense Auth. Provider   lidocaine (XYLOCAINE) 2 % solution Use as directed 15 mLs in the mouth or throat as needed. 100 mL Vallery Sa, Dereona Kolodny L, PA      PDMP not reviewed this encounter.   Estanislado Pandy, [place] 06/22/21 1024

## 2021-06-22 NOTE — Discharge Instructions (Addendum)
Strep, COVID, and flu tests negative today. Sending out strep culture to confirm and will contact you with results. Rest, keep hydrated, and continue with OTC medicine. Use numbing mouthwash as prescribed as needed. Follow-up with PCP or OB if no improvement in a week. Go to the ER if develop inability to swallow or difficulty breathing.

## 2021-06-22 NOTE — ED Triage Notes (Signed)
Pt presents to Urgent Care with c/o sore throat and runny nose x 3 days, afebrile. Has not done COVID test.

## 2021-06-26 DIAGNOSIS — J019 Acute sinusitis, unspecified: Secondary | ICD-10-CM

## 2021-06-26 HISTORY — DX: Acute sinusitis, unspecified: J01.90

## 2021-06-26 LAB — CULTURE, GROUP A STREP: Strep A Culture: NEGATIVE

## 2021-07-17 NOTE — L&D Delivery Note (Signed)
Operative Delivery Note ?At 9:08 PM a viable female was delivered via Vaginal, [employer], operational).  Presentation: vertex; Position: Left,, Occiput,, Anterior; Station: +2. ? ?Patient pushing almost 3 hours with excellent effort. Fetal tachycardia noted with recurrent decels. I recommended expedited delivery for nrFHT. I offered CS or VAVD. She elected VAVD after a discussion regarding r/b of each.  ? ?Verbal consent: obtained from patient.  Risks and benefits discussed in detail.  Risks include, but are not limited to the risks of anesthesia, bleeding, infection, damage to maternal tissues, fetal cephalhematoma.  There is also the risk of inability to effect vaginal delivery of the head, or shoulder dystocia that cannot be resolved by established maneuvers, leading to the need for emergency cesarean section. ? ?APGAR: 6, 8; weight pending.   ?Placenta status: ,routine ?Cord:  with the following complications: tight nuchal - delivered through  Cord pH: sent ? ?Three gentle pulls over three contractions. MLE performed after patient's consent. Vertex delivered easily, shoulder easily.  ? ?Anesthesia:  CLE ?Instruments: kiwi ?Episiotomy: Right Mediolateral ?Lacerations: 2nd degree ?Suture Repair: 3.0 vicryl ?Est. Blood Loss (mL): 400 ? ? ?It's a boy!!   ?Mom to postpartum.  Baby to Couplet care / Skin to Skin. ? ?Melissa Barnes ?11/25/2021, 9:37 PM ? ? ? ?

## 2021-11-25 ENCOUNTER — Encounter (HOSPITAL_COMMUNITY): Payer: Self-pay | Admitting: *Deleted

## 2021-11-25 ENCOUNTER — Inpatient Hospital Stay (HOSPITAL_COMMUNITY)
Admission: AD | Admit: 2021-11-25 | Discharge: 2021-11-27 | DRG: 807 | Disposition: A | Discharge status: Home / Self Care | Payer: No Typology Code available for payment source | Attending: Obstetrics and Gynecology | Admitting: Obstetrics and Gynecology

## 2021-11-25 ENCOUNTER — Other Ambulatory Visit: Payer: Self-pay

## 2021-11-25 ENCOUNTER — Inpatient Hospital Stay (HOSPITAL_COMMUNITY): Payer: No Typology Code available for payment source | Admitting: Anesthesiology

## 2021-11-25 ENCOUNTER — Encounter (HOSPITAL_COMMUNITY): Payer: Self-pay | Admitting: Anesthesiology

## 2021-11-25 DIAGNOSIS — Z3490 Encounter for supervision of normal pregnancy, unspecified, unspecified trimester: Secondary | ICD-10-CM

## 2021-11-25 DIAGNOSIS — Z3A39 39 weeks gestation of pregnancy: Secondary | ICD-10-CM

## 2021-11-25 DIAGNOSIS — O4292 Full-term premature rupture of membranes, unspecified as to length of time between rupture and onset of labor: Secondary | ICD-10-CM | POA: Diagnosis present

## 2021-11-25 DIAGNOSIS — O26893 Other specified pregnancy related conditions, third trimester: Secondary | ICD-10-CM | POA: Diagnosis present

## 2021-11-25 LAB — CBC
HCT: 38.1 % (ref 36.0–46.0)
Hemoglobin: 12.9 g/dL (ref 12.0–15.0)
MCH: 29.2 pg (ref 26.0–34.0)
MCHC: 33.9 g/dL (ref 30.0–36.0)
MCV: 86.2 fL (ref 80.0–100.0)
Platelets: 262 10*3/uL (ref 150–400)
RBC: 4.42 MIL/uL (ref 3.87–5.11)
RDW: 13.8 % (ref 11.5–15.5)
WBC: 8.9 10*3/uL (ref 4.0–10.5)
nRBC: 0 % (ref 0.0–0.2)

## 2021-11-25 LAB — TYPE AND SCREEN
ABO/RH(D): B POS
Antibody Screen: NEGATIVE

## 2021-11-25 LAB — POCT FERN TEST: POCT Fern Test: POSITIVE

## 2021-11-25 LAB — RPR: RPR Ser Ql: NONREACTIVE

## 2021-11-25 MED ORDER — OXYTOCIN-SODIUM CHLORIDE 30-0.9 UT/500ML-% IV SOLN
2.5000 [IU]/h | INTRAVENOUS | Status: DC
  Administered 2021-11-25: 2.5 [IU]/h via INTRAVENOUS

## 2021-11-25 MED ORDER — ZOLPIDEM TARTRATE 5 MG PO TABS: 5.0000 mg | ORAL_TABLET | Freq: Every evening | ORAL | Status: DC | PRN

## 2021-11-25 MED ORDER — ONDANSETRON HCL 4 MG PO TABS: 4.0000 mg | ORAL_TABLET | ORAL | Status: DC | PRN

## 2021-11-25 MED ORDER — LIDOCAINE HCL (PF) 1 % IJ SOLN
INTRAMUSCULAR | Status: DC | PRN
Start: 2021-11-25 — End: 2021-11-25
  Administered 2021-11-25: 8 mL via EPIDURAL

## 2021-11-25 MED ORDER — HYDROXYZINE HCL 50 MG PO TABS: 50.0000 mg | ORAL_TABLET | Freq: Four times a day (QID) | ORAL | Status: DC | PRN

## 2021-11-25 MED ORDER — PHENYLEPHRINE 80 MCG/ML (10ML) SYRINGE FOR IV PUSH (FOR BLOOD PRESSURE SUPPORT): 80.0000 ug | PREFILLED_SYRINGE | INTRAVENOUS | Status: DC | PRN

## 2021-11-25 MED ORDER — PHENYLEPHRINE 80 MCG/ML (10ML) SYRINGE FOR IV PUSH (FOR BLOOD PRESSURE SUPPORT)
80.0000 ug | PREFILLED_SYRINGE | INTRAVENOUS | Status: DC | PRN
  Filled 2021-11-25: qty 10

## 2021-11-25 MED ORDER — OXYTOCIN-SODIUM CHLORIDE 30-0.9 UT/500ML-% IV SOLN
1.0000 m[IU]/min | INTRAVENOUS | Status: DC
  Administered 2021-11-25: 2 m[IU]/min via INTRAVENOUS
  Filled 2021-11-25: qty 500

## 2021-11-25 MED ORDER — FENTANYL-BUPIVACAINE-NACL 0.5-0.125-0.9 MG/250ML-% EP SOLN: 12.0000 mL/h | EPIDURAL | Status: DC | PRN

## 2021-11-25 MED ORDER — EPHEDRINE 5 MG/ML INJ: 10.0000 mg | INTRAVENOUS | Status: DC | PRN

## 2021-11-25 MED ORDER — LACTATED RINGERS IV SOLN
500.0000 mL | Freq: Once | INTRAVENOUS | Status: AC
  Administered 2021-11-25: 500 mL via INTRAVENOUS

## 2021-11-25 MED ORDER — DIPHENHYDRAMINE HCL 25 MG PO CAPS: 25.0000 mg | ORAL_CAPSULE | Freq: Four times a day (QID) | ORAL | Status: DC | PRN

## 2021-11-25 MED ORDER — TRAMADOL HCL 50 MG PO TABS: 50.0000 mg | ORAL_TABLET | Freq: Four times a day (QID) | ORAL | Status: DC | PRN

## 2021-11-25 MED ORDER — ACETAMINOPHEN 325 MG PO TABS
650.0000 mg | ORAL_TABLET | ORAL | Status: DC | PRN
  Administered 2021-11-26: 650 mg via ORAL
  Filled 2021-11-25: qty 2

## 2021-11-25 MED ORDER — BUTORPHANOL TARTRATE 1 MG/ML IJ SOLN: 1.0000 mg | INTRAMUSCULAR | Status: DC | PRN

## 2021-11-25 MED ORDER — ONDANSETRON HCL 4 MG/2ML IJ SOLN: 4.0000 mg | INTRAMUSCULAR | Status: DC | PRN

## 2021-11-25 MED ORDER — OXYTOCIN BOLUS FROM INFUSION
333.0000 mL | Freq: Once | INTRAVENOUS | Status: AC
Start: 2021-11-25 — End: 2021-11-25
  Administered 2021-11-25: 333 mL via INTRAVENOUS

## 2021-11-25 MED ORDER — ONDANSETRON HCL 4 MG/2ML IJ SOLN
4.0000 mg | Freq: Four times a day (QID) | INTRAMUSCULAR | Status: DC | PRN
  Administered 2021-11-25 (×2): 4 mg via INTRAVENOUS
  Filled 2021-11-25 (×2): qty 2

## 2021-11-25 MED ORDER — SIMETHICONE 80 MG PO CHEW: 80.0000 mg | CHEWABLE_TABLET | ORAL | Status: DC | PRN

## 2021-11-25 MED ORDER — LIDOCAINE HCL (PF) 1 % IJ SOLN: 30.0000 mL | INTRAMUSCULAR | Status: DC | PRN

## 2021-11-25 MED ORDER — SOD CITRATE-CITRIC ACID 500-334 MG/5ML PO SOLN: 30.0000 mL | ORAL | Status: DC | PRN

## 2021-11-25 MED ORDER — LACTATED RINGERS IV SOLN: INTRAVENOUS | Status: DC

## 2021-11-25 MED ORDER — TETANUS-DIPHTH-ACELL PERTUSSIS 5-2.5-18.5 LF-MCG/0.5 IM SUSY: 0.5000 mL | PREFILLED_SYRINGE | Freq: Once | INTRAMUSCULAR | Status: DC

## 2021-11-25 MED ORDER — DIPHENHYDRAMINE HCL 50 MG/ML IJ SOLN: 12.5000 mg | INTRAMUSCULAR | Status: DC | PRN

## 2021-11-25 MED ORDER — DIBUCAINE (PERIANAL) 1 % EX OINT: 1.0000 "application " | TOPICAL_OINTMENT | CUTANEOUS | Status: DC | PRN

## 2021-11-25 MED ORDER — ACETAMINOPHEN 500 MG PO TABS
1000.0000 mg | ORAL_TABLET | Freq: Once | ORAL | Status: AC | PRN
  Administered 2021-11-25: 1000 mg via ORAL
  Filled 2021-11-25: qty 2

## 2021-11-25 MED ORDER — TERBUTALINE SULFATE 1 MG/ML IJ SOLN: 0.2500 mg | Freq: Once | INTRAMUSCULAR | Status: DC | PRN

## 2021-11-25 MED ORDER — BENZOCAINE-MENTHOL 20-0.5 % EX AERO
1.0000 "application " | INHALATION_SPRAY | CUTANEOUS | Status: DC | PRN
  Filled 2021-11-25: qty 56

## 2021-11-25 MED ORDER — WITCH HAZEL-GLYCERIN EX PADS: 1.0000 "application " | MEDICATED_PAD | CUTANEOUS | Status: DC | PRN

## 2021-11-25 MED ORDER — SENNOSIDES-DOCUSATE SODIUM 8.6-50 MG PO TABS
2.0000 | ORAL_TABLET | ORAL | Status: DC
  Administered 2021-11-26 (×2): 2 via ORAL
  Filled 2021-11-25 (×2): qty 2

## 2021-11-25 MED ORDER — FENTANYL-BUPIVACAINE-NACL 0.5-0.125-0.9 MG/250ML-% EP SOLN
12.0000 mL/h | EPIDURAL | Status: DC | PRN
  Administered 2021-11-25: 12 mL/h via EPIDURAL
  Filled 2021-11-25: qty 250

## 2021-11-25 MED ORDER — COCONUT OIL OIL
1.0000 "application " | TOPICAL_OIL | Status: DC | PRN
  Administered 2021-11-26: 1 via TOPICAL

## 2021-11-25 MED ORDER — IBUPROFEN 600 MG PO TABS
600.0000 mg | ORAL_TABLET | Freq: Four times a day (QID) | ORAL | Status: DC
  Administered 2021-11-26 – 2021-11-27 (×8): 600 mg via ORAL
  Filled 2021-11-25 (×8): qty 1

## 2021-11-25 MED ORDER — LACTATED RINGERS IV SOLN
500.0000 mL | INTRAVENOUS | Status: DC | PRN
  Administered 2021-11-25 (×2): 500 mL via INTRAVENOUS

## 2021-11-25 MED ORDER — PRENATAL MULTIVITAMIN CH
1.0000 | ORAL_TABLET | Freq: Every day | ORAL | Status: DC
  Administered 2021-11-26 – 2021-11-27 (×2): 1 via ORAL
  Filled 2021-11-25 (×2): qty 1

## 2021-11-25 NOTE — Progress Notes (Signed)
Doing well. Comf with cle. ?SVE 3/100/-1, no forebag.  ?Given no change in cervical dilation, I again recommend pitocin. R/b discussed. She accepts pitocin. ?Will ctm closely.  ?

## 2021-11-25 NOTE — Progress Notes (Signed)
C/c+1 push 

## 2021-11-25 NOTE — Progress Notes (Signed)
Pushing with good effort.  ?Vertex remains OP despite multiple manual rotation attempts and position changes.  ?Making some progress. ?Continue to push.

## 2021-11-25 NOTE — Anesthesia Procedure Notes (Signed)
Epidural ?Patient location during procedure: OB ?Start time: 11/25/2021 11:44 AM ?End time: 11/25/2021 11:50 AM ? ?Staffing ?Anesthesiologist: Bethena Midget, MD ? ?Preanesthetic Checklist ?Completed: patient identified, IV checked, site marked, risks and benefits discussed, surgical consent, monitors and equipment checked, pre-op evaluation and timeout performed ? ?Epidural ?Patient position: sitting ?Prep: DuraPrep and site prepped and draped ?Patient monitoring: continuous pulse ox and blood pressure ?Approach: midline ?Location: L3-L4 ?Injection technique: LOR air ? ?Needle:  ?Needle type: Tuohy  ?Needle gauge: 17 G ?Needle length: 9 cm and 9 ?Needle insertion depth: 5 cm ?Catheter type: closed end flexible ?Catheter size: 19 Gauge ?Catheter at skin depth: 10 cm ?Test dose: negative ? ?Assessment ?Events: blood not aspirated, injection not painful, no injection resistance, no paresthesia and negative IV test ? ? ? ?

## 2021-11-25 NOTE — Progress Notes (Addendum)
Had one prolonged decel 2 minutes that resolved with position change ?SVE at that time 6/100/0.  ?Contractions unable to be traced. Thus, IUPC placed.  ?SVE 8/100/0, DOP, manual rotation failed. ?Will try hands and knees. ?Pitocin at 4 with borderline MVUs - will hold at 4 for now. FHT now reassuring.  ?D/w patient and her family all of the above and the plan. All questions answered and all concerns addressed.  ? ? ?Rosie Fate MD ?

## 2021-11-25 NOTE — MAU Note (Signed)
.  Melissa Barnes is a 31 y.o. at [redacted]w[redacted]d here in MAU reporting: pt reprots she lost mucus plug during the night  has had a trickle of clear to pink fluid. Reports ctx/cramping q 5-8 min. Good fetal movement reported. 3cm/50 on Wed. ? ?Onset of complaint: last night ?Pain score: 6/10 ?There were no vitals filed for this visit.   ?FHT:127 ?Lab orders placed from triage:  fern ? ? ?

## 2021-11-25 NOTE — H&P (Signed)
Melissa Barnes is a 31 y.o. female presenting for ROM at 0530. She has a history of a lumbar laminectomy and Anesthesia will be made aware of this. Otherwise pregnancy has been uncomplicated.  ? ?OB History   ? ? Gravida  ?1  ? Para  ?   ? Term  ?   ? Preterm  ?   ? AB  ?   ? Living  ?   ?  ? ? SAB  ?   ? IAB  ?   ? Ectopic  ?   ? Multiple  ?   ? Live Births  ?   ?   ?  ?  ? ?Past Medical History:  ?Diagnosis Date  ? Anemia   ? DDD (degenerative disc disease)   ? Unspecified vitamin D deficiency   ? ?Past Surgical History:  ?Procedure Laterality Date  ? CHOLECYSTECTOMY N/A 03/04/2018  ? Procedure: LAPAROSCOPIC CHOLECYSTECTOMY WITH INTRAOPERATIVE CHOLANGIOGRAM ERAS PATHWAY;  Surgeon: Griselda Miner, MD;  Location: [facility] OR;  Service: General;  Laterality: N/A;  ? CYST REMOVAL TRUNK Right 2009  ? chest wall  ? LUMBAR LAMINECTOMY/DECOMPRESSION MICRODISCECTOMY Right 11/27/2012  ? Procedure: LUMBAR LAMINECTOMY/DECOMPRESSION MICRODISCECTOMY;  Surgeon: Emilee Hero, MD;  Location: [facility] OR;  Service: Orthopedics;  Laterality: Right;  Right-sided lumbar 5-sacral 1 microdisectomy  ? WISDOM TOOTH EXTRACTION    ? ?Family History: family history includes Cancer in her paternal grandmother; Healthy in her mother; Hypertension in her father. ?Social History:  reports that she has never smoked. She has never used smokeless tobacco. She reports that she does not currently use alcohol. She reports that she does not use drugs. ? ? ?  ?Maternal Diabetes: No ?Genetic Screening: Normal ?Maternal Ultrasounds/Referrals: Normal ?Fetal Ultrasounds or other Referrals:  None ?Maternal Substance Abuse:  No ?Significant Maternal Medications:  None ?Significant Maternal Lab Results:  None and Group B Strep negative ?Other Comments:  None ? ?Review of Systems ?History ?  ?Blood pressure 119/85, pulse 76, temperature 98.1 ?F (36.7 ?C), resp. rate 18, height 5\' 7"  (1.702 m), weight 95.3 kg, last menstrual period 12/15/2020. ?Exam ?Physical Exam   ?NAD, A&O ?NWOB ?Abd soft, nondistended, gravid ?SVE 3/80/-1 ? ?Prenatal labs: ?ABO, Rh:  B+ ?Antibody:  Neg ?Rubella:   ?RPR:    ?HBsAg:    ?HIV:    ?GBS:   negative ? ?Assessment/Plan: ?31 yo G1P0 @ 39.6 wga with ROM.  ?I recommend that we augment with pitocin given SVE unchanged from office and s/p PROM several hours ago. She is acontractile on the monitor. However, she does report having contractions every 8 minutes. She declines pitocin augmentation at this time. I d/w the patient why I recommend this. She declines. She prefers to be checked in a few hours nad reconsider pitocin.  ?All questions answered.   ?GBS negative. ? ? ?30 ?11/25/2021, 8:26 AM ? ? ? ? ?

## 2021-11-25 NOTE — Progress Notes (Signed)
9.5/c/0, still OP ?Nurses in room attempting position changes to help facilitate rotation to OA ?FHT overall reassuring ?

## 2021-11-25 NOTE — Progress Notes (Signed)
Pt informed that the ultrasound is considered a limited OB ultrasound and is not intended to be a complete ultrasound exam.  Patient also informed that the ultrasound is not being completed with the intent of assessing for fetal or placental anomalies or any pelvic abnormalities.  Explained that the purpose of today?s ultrasound is to assess for  presentation.  Patient acknowledges the purpose of the exam and the limitations of the study.   ? ?VERTEX  ? ?Thressa Sheller DNP, CNM  ?11/25/21  8:27 AM  ? ?

## 2021-11-25 NOTE — Lactation Note (Addendum)
This note was copied from a baby's chart. ?Lactation Consultation Note ? ?Patient Name: Melissa Barnes ?Today's Date: 11/25/2021 ?Reason for consult: L&D Initial assessment;Primapara;1st time breastfeeding;Term;Breastfeeding assistance;Other (Comment) (Vit D Def, Anemia) ?Age:33 hours ? ?Infant not able to sustain latch, popping on and off breast with few sucks. Mom keeping infant STS lick and taste colostrum.  ?LC or RN to follow up once on the floor.  ? ?Maternal Data ?  ? ?Feeding ?Mother's Current Feeding Choice: Breast Milk ? ?LATCH Score ?Latch: Repeated attempts needed to sustain latch, nipple held in mouth throughout feeding, stimulation needed to elicit sucking reflex. ? ?Audible Swallowing: None ? ?Type of Nipple: Everted at rest and after stimulation ? ?Comfort (Breast/Nipple): Soft / non-tender ? ?Hold (Positioning): Assistance needed to correctly position infant at breast and maintain latch. ? ?LATCH Score: 6 ? ? ?Lactation Tools Discussed/Used ?  ? ?Interventions ?Interventions: Breast feeding basics reviewed;Assisted with latch;Skin to skin;Breast compression;Expressed milk;Hand express;Education ? ?Discharge ?  ? ?Consult Status ?Consult Status: Follow-up from L&D ?Date: 11/26/21 ?Follow-up type: In-patient ? ? ? ?[name] ?11/25/2021, 10:09 PM ? ? ? ?

## 2021-11-25 NOTE — Anesthesia Preprocedure Evaluation (Signed)
Anesthesia Evaluation  ?Patient identified by MRN, date of birth, ID band ?Patient awake ? ? ? ?Reviewed: ?Allergy & Precautions, H&P , NPO status , Patient's Chart, lab work & pertinent test results, reviewed documented beta blocker date and time  ? ?Airway ?Mallampati: II ? ?TM Distance: >3 FB ?Neck ROM: full ? ? ? Dental ?no notable dental hx. ? ?  ?Pulmonary ?neg pulmonary ROS,  ?  ?Pulmonary exam normal ?breath sounds clear to auscultation ? ? ? ? ? ? Cardiovascular ?negative cardio ROS ?Normal cardiovascular exam ?Rhythm:regular Rate:Normal ? ? ?  ?Neuro/Psych ?negative neurological ROS ? negative psych ROS  ? GI/Hepatic ?negative GI ROS, Neg liver ROS,   ?Endo/Other  ?negative endocrine ROS ? Renal/GU ?negative Renal ROS  ?negative genitourinary ?  ?Musculoskeletal ? ? Abdominal ?  ?Peds ? Hematology ? ?(+) Blood dyscrasia, anemia ,   ?Anesthesia Other Findings ? ? Reproductive/Obstetrics ?(+) Pregnancy ? ?  ? ? ? ? ? ? ? ? ? ? ? ? ? ?  ?  ? ? ? ? ? ? ? ? ?Anesthesia Physical ?Anesthesia Plan ? ?ASA: 2 ? ?Anesthesia Plan: Epidural  ? ?Post-op Pain Management: Minimal or no pain anticipated  ? ?Induction:  ? ?PONV Risk Score and Plan: 2 ? ?Airway Management Planned: Natural Airway ? ?Additional Equipment: None ? ?Intra-op Plan:  ? ?Post-operative Plan:  ? ?Informed Consent: I have reviewed the patients History and Physical, chart, labs and discussed the procedure including the risks, benefits and alternatives for the proposed anesthesia with the patient or authorized representative who has indicated his/her understanding and acceptance.  ? ? ? ?Dental Advisory Given ? ?Plan Discussed with: Anesthesiologist ? ?Anesthesia Plan Comments: (Labs checked- platelets confirmed with RN in room. Fetal heart tracing, per RN, reported to be stable enough for sitting procedure. ?Discussed epidural, and patient consents to the procedure:  included risk of possible headache,backache, failed  block, allergic reaction, and nerve injury. This patient was asked if she had any questions or concerns before the procedure started.)  ? ? ? ? ? ? ?Anesthesia Quick Evaluation ? ?

## 2021-11-26 ENCOUNTER — Inpatient Hospital Stay (HOSPITAL_COMMUNITY): Admit: 2021-11-26 | Payer: No Typology Code available for payment source | Admitting: Obstetrics & Gynecology

## 2021-11-26 LAB — CBC
HCT: 33.7 % — ABNORMAL LOW (ref 36.0–46.0)
Hemoglobin: 11.2 g/dL — ABNORMAL LOW (ref 12.0–15.0)
MCH: 29 pg (ref 26.0–34.0)
MCHC: 33.2 g/dL (ref 30.0–36.0)
MCV: 87.3 fL (ref 80.0–100.0)
Platelets: 239 10*3/uL (ref 150–400)
RBC: 3.86 MIL/uL — ABNORMAL LOW (ref 3.87–5.11)
RDW: 13.9 % (ref 11.5–15.5)
WBC: 15.1 10*3/uL — ABNORMAL HIGH (ref 4.0–10.5)
nRBC: 0 % (ref 0.0–0.2)

## 2021-11-26 NOTE — Anesthesia Postprocedure Evaluation (Signed)
Anesthesia Post Note ? ?Patient: Melissa Barnes ? ?Procedure(s) Performed: AN AD HOC LABOR EPIDURAL ? ?  ? ?Patient location during evaluation: PACU ?Anesthesia Type: Epidural ?Level of consciousness: awake, awake and alert and oriented ?Pain management: pain level controlled ?Vital Signs Assessment: post-procedure vital signs reviewed and stable ?Respiratory status: spontaneous breathing, nonlabored ventilation and respiratory function stable ?Cardiovascular status: stable ?Postop Assessment: no headache, no backache, epidural receding, patient able to bend at knees and no signs of nausea or vomiting ?Anesthetic complications: no ? ? ?No notable events documented. ? ?Last Vitals:  ?Vitals:  ? 11/26/21 0020 11/26/21 0516  ?BP: 118/64 101/63  ?Pulse: 71 67  ?Resp:    ?Temp: 36.9 ?C 37.2 ?C  ?SpO2: 98%   ?  ?Last Pain:  ?Vitals:  ? 11/26/21 0020  ?TempSrc: Oral  ?PainSc:   ? ?Pain Goal:   ? ?  ?  ?  ?  ?  ?  ?  ? ?Collene Schlichter ? ? ? ? ?

## 2021-11-26 NOTE — Lactation Note (Signed)
This note was copied from a baby's chart. ?Lactation Consultation Note ? ?Patient Name: Melissa Barnes ?Today's Date: 11/26/2021 ?Reason for consult: Initial assessment;1st time breastfeeding;Term ?Age:33 hours ?P1, term female infant, -1% weight loss.  ?LC entered room, infant asleep, per mom, infant recently BF for 35 minutes at 1522 pm. ?Mom would like LC follow up later regarding latch, overall mom feels breastfeeding is going well. ?Mom will continue to BF infant according hunger cues, on demand, 8 to 12 times within 24 hours, skin to skin. ?[name] written name on white board, mom will call if she would like to be seen by [facility] later today. ?Mom made aware of O/P services, breastfeeding support groups, community resources, and our phone # for post-discharge questions.   ?Maternal Data ?Has patient been taught Hand Expression?: Yes ?Does the patient have breastfeeding experience prior to this delivery?: No ? ?Feeding ?Mother's Current Feeding Choice: Breast Milk ? ?LATCH Score ?  ? ?  ? ?  ? ?  ? ?  ? ?  ? ? ?Lactation Tools Discussed/Used ?  ? ?Interventions ?Interventions: Breast feeding basics reviewed;Skin to skin;Position options;Education;LC Services brochure ? ?Discharge ?  ? ?Consult Status ?Consult Status: Follow-up ?Date: 11/27/21 ?Follow-up type: In-patient ? ? ? ?Vicente Serene ?11/26/2021, 6:33 PM ? ? ? ?

## 2021-11-26 NOTE — Progress Notes (Signed)
Post Partum Day 1 s/p VAVD after PROM ?Subjective: ?no complaints, up ad lib, voiding, and tolerating PO ? ?Objective: ?Blood pressure 101/63, pulse 67, temperature 99 ?F (37.2 ?C), resp. rate 18, height 5\' 7"  (1.702 m), weight 95.3 kg, last menstrual period 12/15/2020, SpO2 98 %, unknown if currently breastfeeding. ? ?Physical Exam:  ?General: alert ?Lochia: appropriate ?Uterine Fundus: firm ?Incision: N/A ?DVT Evaluation: No evidence of DVT seen on physical exam. ? ?Recent Labs  ?  11/25/21 ?0832 11/26/21 ?11/28/21  ?HGB 12.9 11.2*  ?HCT 38.1 33.7*  ? ? ?Assessment/Plan: ?Plan for discharge tomorrow, Breastfeeding, and Circumcision prior to discharge ? ? LOS: 1 day  ? ?8828 ?11/26/2021, 9:24 AM  ? ? ?

## 2021-11-27 NOTE — Lactation Note (Signed)
This note was copied from a baby's chart. ?Lactation Consultation Note ? ?Patient Name: Melissa Barnes ?Today's Date: 11/27/2021 ?Reason for consult: Other (Comment) (baby more awake after feeding both breast 10 mins each . LC assisted to relatch on the left in the football position / baby fed 15 nins , and LC added 5 F SNS , to see if the baby's feeding pattern would improve and be less sluggish. improved.) ?Age:33 hours ?2nd consult for today, baby has been sluggish post circ.  ?See the doc flow sheets for the right breast latch and the left breast latch.  ?The 3rd latch was more nutritive for the baby and baby was able to increase the  ?Feeding rhythm and a lot less sluggish compared to the 1st 2 latches.  ?Blakely reassured mom the baby will have more energy to feed since receiving a boost of calories.  ?Mom noted to very tired and mentioned she had been up a lot last night with the baby. LC encouraged a nap while the baby was napping this afternoon.  ?Plan was to go home today.  ?LC recommended staying for another feeding assessment by LC. Mom aware to call.  ?Latch 8-8-9  ?Maternal Data ?Has patient been taught Hand Expression?: Yes ? ?Feeding ?Mother's Current Feeding Choice: Breast Milk and Formula ? ?LATCH Score ?Latch: Grasps breast easily, tongue down, lips flanged, rhythmical sucking. ? ?Audible Swallowing: Spontaneous and intermittent ? ?Type of Nipple: Everted at rest and after stimulation ? ?Comfort (Breast/Nipple): Soft / non-tender ? ?Hold (Positioning): Assistance needed to correctly position infant at breast and maintain latch. ? ?LATCH Score: 9 ? ? ?Lactation Tools Discussed/Used ?Tools: Pump;Flanges ?Flange Size: 24;27 ?Breast pump type: Manual ? ?Interventions ?Interventions: Breast feeding basics reviewed;Assisted with latch;Skin to skin;Breast massage;Hand express;Reverse pressure;Breast compression;Adjust position;Support pillows;Position options;Shells;Hand pump;Education ? ?Discharge ?Pump:  DEBP;Manual;Personal ?WIC Program: No ? ?Consult Status ?Consult Status: Follow-up ?Date: 11/27/21 ?Follow-up type: In-patient ? ? ? ?Jerlyn Ly Brightyn Mozer ?11/27/2021, 1:59 PM ? ? ? ?

## 2021-11-27 NOTE — Discharge Summary (Signed)
? ?  Postpartum Discharge Summary ? ?Date of Service updated 11/27/21 ? ?   ?Patient Name: Melissa Barnes ?DOB: 1991-04-08 ?MRN: [id number] ? ?Date of admission: 11/25/2021 ?Delivery date:11/25/2021  ?Delivering provider: Lucillie Garfinkel JENNIFER  ?Date of discharge: 11/27/2021 ? ?Admitting diagnosis: Pregnancy [Z34.90] ?Intrauterine pregnancy: [redacted]w[redacted]d    ?Secondary diagnosis:  Principal Problem: ?  Pregnancy ? ?Additional problems: None    ?Discharge diagnosis: Term Pregnancy Delivered                                              ?Post partum procedures: None ?Augmentation: Pitocin ?Complications: None ? ?Hospital course: Induction of Labor With Vaginal Delivery   ?31y.o. yo G1P1001 at 360w6das admitted to the hospital 11/25/2021 for induction of labor.  Indication for induction: PROM.  Patient had an uncomplicated labor course as follows: ?Membrane Rupture Time/Date: 5:30 AM ,11/25/2021   ?Delivery Method:Vaginal, Vacuum (Extractor)  ?Episiotomy: Right Mediolateral  ?Lacerations:  2nd degree  ?Details of delivery can be found in separate delivery note.  Patient had a routine postpartum course. Patient is discharged home 11/27/21. ? ?Newborn Data: ?Birth date:11/25/2021  ?Birth time:9:08 PM  ?Gender:Female  ?Living status:Living  ?Apgars:6 ,8  ?Weight:3410 g  ? ?Magnesium Sulfate received: No ?BMZ received: No ?Rhophylac:N/A ?MMR:N/A ?T-DaP:Given prenatally ?Flu: N/A ?Transfusion:No ? ?Physical exam  ?Vitals:  ? 11/26/21 0910 11/26/21 1230 11/26/21 2104 11/27/21 0531  ?BP: 107/66 120/77 125/76 112/69  ?Pulse: 66 64 69 63  ?Resp: _0 ?Temp: 98.1 ?F (36.7 ?C) 98 ?F (36.7 ?C) 98.7 ?F (37.1 ?C) 98.8 ?F (37.1 ?C)  ?TempSrc: Oral Oral Oral Oral  ?SpO2: 97% 100% 99% 100%  ?Weight:      ?Height:      ? ?General: alert ?Lochia: appropriate ?Uterine Fundus: firm ?Incision: N/A ?DVT Evaluation: No evidence of DVT seen on physical exam. ?Labs: ?Lab Results  ?Component Value Date  ? WBC 15.1 (H) 11/26/2021  ? HGB 11.2 (L)  11/26/2021  ? HCT 33.7 (L) 11/26/2021  ? MCV 87.3 11/26/2021  ? PLT 239 11/26/2021  ? ? ?  Latest Ref Rng & Units 03/15/2021  ? 11:05 AM  ?CMP  ?Glucose 70 - 99 mg/dL 96    ?BUN 6 - 20 mg/dL 11    ?Creatinine 0.44 - 1.00 mg/dL 0.63    ?Sodium 135 - 145 mmol/L 137    ?Potassium 3.5 - 5.1 mmol/L 3.7    ?Chloride 98 - 111 mmol/L 106    ?CO2 22 - 32 mmol/L 25    ?Calcium 8.9 - 10.3 mg/dL 8.5    ?Total Protein 6.5 - 8.1 g/dL 7.2    ?Total Bilirubin 0.3 - 1.2 mg/dL 0.6    ?Alkaline Phos 38 - 126 U/L 58    ?AST 15 - 41 U/L 28    ?ALT 0 - 44 U/L 16    ? ?Edinburgh Score: ?   ? View : No data to display.  ?  ?  ?  ? ? ? ? ?After visit meds:  ?Allergies as of 11/27/2021   ? ?   Reactions  ? Minocycline Hives  ? Penicillins Hives  ? Vicodin [hydrocodone-acetaminophen] Hives  ? Codeine Hives  ? ?  ? ?  ?Medication List  ?  ? ?STOP taking these medications   ? ?alum & mag hydroxide-simeth 200-200-20  MG/5ML suspension ?Commonly known as: MAALOX/MYLANTA ?  ?cholecalciferol 25 MCG (1000 UNIT) tablet ?Commonly known as: VITAMIN D3 ?  ?PRILOSEC PO ?  ? ?  ? ?TAKE these medications   ? ?PRENATAL PO ?Take 1 tablet by mouth daily. ?  ? ?  ? ?D/W patient female infant circumcision, risks/benefits reviewed. All questions answered. ? ? ?Discharge home in stable condition ?Infant Feeding: Bottle and Breast ?Infant Disposition:home with mother ?Discharge instruction: per After Visit Summary and Postpartum booklet. ?Activity: Advance as tolerated. Pelvic rest for 6 weeks.  ?Diet: routine diet ?Anticipated Birth Control: Unsure ?Postpartum Appointment:6 weeks ?Additional Postpartum F/U:  None ?Future Appointments:No future appointments. ?Follow up Visit: ? ? ?  ? ?11/27/2021 ?Tyson Dense, MD ? ? ?

## 2021-11-27 NOTE — Lactation Note (Signed)
This note was copied from a baby's chart. ?Lactation Consultation Note ? ?Patient Name: Melissa Barnes ?Today's Date: 11/27/2021 ?Reason for consult: Follow-up assessment;Primapara;1st time breastfeeding;Term;Infant weight loss;Other (Comment) (per mom baby was cluster feeding last night , supplemented with formula , and since has been sleepy, also a circ. LC reviewed the doc flow sheets with mom and dad / WNL for age. mom plans to try hourly to latch until a good feeding.) ?Age:33 hours ?Per mom baby is latching well and when latching initially pinching.  ?LC offered to assess breast tissue , and mom compressed the areolas, noted edema. LC instructed mom on the use of the reverse pressure exercise, shells while awake and coconut oil to moisten the areolas.  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  ? ?Maternal Data ?  ? ?Feeding ?Mother's Current Feeding Choice: Breast Milk and Formula ? ?LATCH Score ( Latch score by the [facility] )  ?Latch: Grasps breast easily, tongue down, lips flanged, rhythmical sucking. ? ?Audible Swallowing: A few with stimulation ? ?Type of Nipple: Everted at rest and after stimulation ? ?Comfort (Breast/Nipple): Soft / non-tender ? ?Hold (Positioning): Assistance needed to correctly position infant at breast and maintain latch. ? ?LATCH Score: 8 ? ? ?Lactation Tools Discussed/Used ?Tools: Pump;Flanges;Coconut oil ?Flange Size: 24;27 ?Breast pump type: Manual;Other (comment) (mom already has the hand pump) ? ?Interventions ?  ? ?Discharge ?Discharge Education: Engorgement and breast care;Outpatient recommendation;Other (comment) (if needed) ?Pump: DEBP;Manual;Personal ? ?Consult Status ?Consult Status: Complete ?Date: 11/27/21 ? ? ? ?Melissa Barnes ?11/27/2021, 10:03 AM ? ? ? ?

## 2021-11-27 NOTE — Lactation Note (Addendum)
This note was copied from a baby's chart. ?Lactation Consultation Note ? ?Patient Name: Melissa Barnes ?Today's Date: 11/27/2021 ?Reason for consult: Follow-up assessment;Mother's request;Difficult latch;Term;Breastfeeding assistance ?Age:33 hours ? ?LC assisted latching infant at the breast with compression, noting increase in depth of swallows. Infant supplemented with formula while latched took about 5 ml via 5 french. Mom preference to observe latch without supplementing with 5 french, infant latched for 30 min. Infant still showing cues, supplementing need greater, parents switched to pace bottle feeding with slow flow nipple offering 20 ml.  ? ?Plan 1. To feed based on cues 8-12x 24hr period. Mom to offer breasts and look for signs of milk transfer.  ?2. Mom to supplement with EBM first followed by formula with pace bottle feeding and slow flow nipple. ( BF supplementation guide reviewed. Parents aware to offer more if infant not latching or needs more volume. )  ?3. Mom to use personal pump at home q 3hrs for 15 min  ? ?LC shared results above with provider, Allie Herlong.  ? ?All questions answered at the end of the visit.  ? ? ?Maternal Data ?  ? ?Feeding ?Mother's Current Feeding Choice: Breast Milk and Formula ?Nipple Type: Slow - flow ? ?LATCH Score ?Latch: Repeated attempts needed to sustain latch, nipple held in mouth throughout feeding, stimulation needed to elicit sucking reflex. ? ?Audible Swallowing: Spontaneous and intermittent ? ?Type of Nipple: Everted at rest and after stimulation ? ?Comfort (Breast/Nipple): Soft / non-tender ? ?Hold (Positioning): Assistance needed to correctly position infant at breast and maintain latch. ? ?LATCH Score: 8 ? ? ?Lactation Tools Discussed/Used ?Tools: 34F feeding tube / Syringe;Pump;Flanges ?Flange Size: 27 ?Breast pump type: Manual ?Pump Education: Setup, frequency, and cleaning;Milk Storage ?Reason for Pumping: increase stimulation ?Pumping frequency:  every 3 hrs for 10 min each breast ? ?Interventions ?Interventions: Breast feeding basics reviewed;Assisted with latch;Skin to skin;Breast massage;Hand express;Breast compression;Adjust position;Support pillows;Position options;Expressed milk;DEBP;Ice;Pace feeding;Infant Driven Feeding Algorithm education ? ?Discharge ?Discharge Education: Engorgement and breast care;Warning signs for feeding baby;Outpatient recommendation ? ?Consult Status ?Consult Status: Complete ?Date: 11/27/21 ?Follow-up type: In-patient ? ? ? ?Surabhi Gadea  Nicholson-Springer ?11/27/2021, 4:43 PM ? ? ? ?

## 2021-11-30 ENCOUNTER — Telehealth (HOSPITAL_COMMUNITY): Payer: Self-pay | Admitting: Lactation Services

## 2021-11-30 NOTE — Telephone Encounter (Signed)
Mother called with disappointment that the employee pump she received was not a hands free pump like she had requested.  Dept currently out of freestyle hands free and mother has not opened pump.  Let mother know if one comes in we will make exception and exchange as long as it has not been opened until 7/17. ? ? ?

## 2021-12-05 ENCOUNTER — Telehealth (HOSPITAL_COMMUNITY): Payer: Self-pay | Admitting: *Deleted

## 2021-12-05 NOTE — Telephone Encounter (Signed)
Mom reports feeling good. No concerns about herself at this time. EPDS=0 [facility] score=0) Mom reports baby is doing well. Feeding, peeing, and pooping without difficulty. Safe sleep reviewed. Mom reports no concerns about baby at present.  Duffy Rhody, RN 12-05-2021 at 11:28am

## 2022-04-03 ENCOUNTER — Other Ambulatory Visit (HOSPITAL_COMMUNITY): Payer: Self-pay

## 2022-04-03 MED ORDER — DOXYCYCLINE HYCLATE 100 MG PO TABS
100.0000 mg | ORAL_TABLET | Freq: Two times a day (BID) | ORAL | 0 refills | Status: DC
Start: 2022-04-03 — End: 2023-02-11
  Filled 2022-04-03 (×2): qty 14, 7d supply, fill #0

## 2022-04-05 ENCOUNTER — Other Ambulatory Visit (HOSPITAL_COMMUNITY): Payer: Self-pay

## 2022-05-08 ENCOUNTER — Other Ambulatory Visit (HOSPITAL_COMMUNITY): Payer: Self-pay

## 2022-05-08 MED ORDER — AZITHROMYCIN 250 MG PO TABS
ORAL_TABLET | ORAL | 0 refills | Status: AC
  Filled 2022-05-08: qty 6, 5d supply, fill #0

## 2022-06-19 ENCOUNTER — Encounter: Payer: Self-pay | Admitting: Emergency Medicine

## 2022-06-19 ENCOUNTER — Ambulatory Visit
Admission: EM | Admit: 2022-06-19 | Discharge: 2022-06-19 | Disposition: A | Discharge status: Home / Self Care | Payer: No Typology Code available for payment source | Attending: Family Medicine | Admitting: Family Medicine

## 2022-06-19 DIAGNOSIS — J111 Influenza due to unidentified influenza virus with other respiratory manifestations: Secondary | ICD-10-CM | POA: Insufficient documentation

## 2022-06-19 DIAGNOSIS — R509 Fever, unspecified: Secondary | ICD-10-CM | POA: Insufficient documentation

## 2022-06-19 DIAGNOSIS — Z1152 Encounter for screening for COVID-19: Secondary | ICD-10-CM | POA: Diagnosis not present

## 2022-06-19 LAB — RESP PANEL BY RT-PCR (RSV, FLU A&B, COVID)  RVPGX2
Influenza A by PCR: NEGATIVE
Influenza B by PCR: NEGATIVE
Resp Syncytial Virus by PCR: NEGATIVE
SARS Coronavirus 2 by RT PCR: NEGATIVE

## 2022-06-19 LAB — POCT RAPID STREP A (OFFICE): Rapid Strep A Screen: NEGATIVE

## 2022-06-19 NOTE — Discharge Instructions (Signed)
Check my chart for test results I will call in any needed medication

## 2022-06-19 NOTE — ED Triage Notes (Signed)
Patient c/o fever, sore throat, body aches and chills x 1 day.  Patient has taken Tylenol 1000mg .

## 2022-06-19 NOTE — ED Provider Notes (Signed)
[facility]    CSN: [id number] Arrival date & time: 06/19/22  0802      History   Chief Complaint Chief Complaint  Patient presents with   Fever    HPI Melissa Barnes is a 31 y.o. female.   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  She did a COVID test at home that was negative. Is here to get infection testing prior to work.  Concern for her child at home.  Past Medical History:  Diagnosis Date   Anemia    DDD (degenerative disc disease)    Unspecified vitamin D deficiency     Patient Active Problem List   Diagnosis Date Noted   Pregnancy 11/25/2021   Unspecified vitamin D deficiency 06/02/2013   DDD (degenerative disc disease) 06/02/2013   Other B-complex deficiencies 06/02/2013    Past Surgical History:  Procedure Laterality Date   CHOLECYSTECTOMY N/A 03/04/2018   Procedure: LAPAROSCOPIC CHOLECYSTECTOMY WITH INTRAOPERATIVE CHOLANGIOGRAM ERAS PATHWAY;  Surgeon: Melissa Miner, MD;  Location: [facility] OR;  Service: General;  Laterality: N/A;   CYST REMOVAL TRUNK Right 2009   chest wall   LUMBAR LAMINECTOMY/DECOMPRESSION MICRODISCECTOMY Right 11/27/2012   Procedure: LUMBAR LAMINECTOMY/DECOMPRESSION MICRODISCECTOMY;  Surgeon: Melissa Hero, MD;  Location: [facility] OR;  Service: Orthopedics;  Laterality: Right;  Right-sided lumbar 5-sacral 1 microdisectomy   WISDOM TOOTH EXTRACTION      OB History     Gravida  1   Para  1   Term  1   Preterm      AB      Living  1      SAB      IAB      Ectopic      Multiple  0   Live Births  1            Home Medications    Prior to Admission medications   Medication Sig Start Date End Date Taking? Authorizing Provider  JUNEL FE 24 1-20 MG-MCG(24) tablet Take 1 tablet by mouth daily. 06/02/22  Yes [provider]  doxycycline (VIBRA-TABS) 100 MG tablet Take 1  tablet by mouth twice a day for 7 days. 04/03/22     Prenatal Vit-Fe Fumarate-FA (PRENATAL PO) Take 1 tablet by mouth daily.    [provider]  norgestimate-ethinyl estradiol (ORTHO-CYCLEN,SPRINTEC,PREVIFEM) 0.25-35 MG-MCG tablet Take 1 tablet by mouth daily.  10/23/20  [provider]    Family History Family History  Problem Relation Age of Onset   Healthy Mother    Hypertension Father    Cancer Paternal Grandmother        ovarian    Social History Social History   Tobacco Use   Smoking status: Never   Smokeless tobacco: Never  Vaping Use   Vaping Use: Never used  Substance Use Topics   Alcohol use: Not Currently   Drug use: No     Allergies   Minocycline, Penicillins, Vicodin [hydrocodone-acetaminophen], and Codeine   Review of Systems Review of Systems See HPI  Physical Exam Triage Vital Signs ED Triage Vitals  Enc Vitals Group     BP 06/19/22 0820 110/76     Pulse Rate 06/19/22 0820 82     Resp 06/19/22 0820 18     Temp 06/19/22 0820 98.3 F (36.8 C)     Temp  Source 06/19/22 0820 Oral     SpO2 06/19/22 0820 97 %     Weight 06/19/22 0822 205 lb (93 kg)     Height 06/19/22 0822 5\' 7"  (1.702 m)     Head Circumference --      Peak Flow --      Pain Score 06/19/22 0821 3     Pain Loc --      Pain Edu? --      Excl. in GC? --    No data found.  Updated Vital Signs BP 110/76 (BP Location: Right Arm)   Pulse 82   Temp 98.3 F (36.8 C) (Oral)   Resp 18   Ht 5\' 7"  (1.702 m)   Wt 93 kg   LMP 06/16/2022   SpO2 97%   Breastfeeding No   BMI 32.11 kg/m      Physical Exam Constitutional:      General: She is not in acute distress.    Appearance: She is well-developed. She is ill-appearing.  HENT:     Head: Normocephalic and atraumatic.     Right Ear: Tympanic membrane and ear canal normal.     Left Ear: Tympanic membrane and ear canal normal.     Nose: Nose normal.     Mouth/Throat:     Pharynx: Posterior oropharyngeal erythema  present.  Eyes:     Conjunctiva/sclera: Conjunctivae normal.     Pupils: Pupils are equal, round, and reactive to light.  Cardiovascular:     Rate and Rhythm: Normal rate and regular rhythm.     Heart sounds: Normal heart sounds.  Pulmonary:     Effort: Pulmonary effort is normal. No respiratory distress.     Breath sounds: Normal breath sounds.  Abdominal:     General: There is no distension.     Palpations: Abdomen is soft.  Musculoskeletal:        General: Normal range of motion.     Cervical back: Normal range of motion.  Lymphadenopathy:     Cervical: Cervical adenopathy present.  Skin:    General: Skin is warm and dry.  Neurological:     Mental Status: She is alert.  Psychiatric:        Mood and Affect: Mood normal.        Behavior: Behavior normal.      UC Treatments / Results  Labs (all labs ordered are listed, but only abnormal results are displayed) Labs Reviewed  RESP PANEL BY RT-PCR (RSV, FLU A&B, COVID)  [id number]  POCT RAPID STREP A (OFFICE)    EKG   Radiology No results found.  Procedures Procedures (including critical care time)  Medications Ordered in UC Medications - No data to display  Initial Impression / Assessment and Plan / UC Course  I have reviewed the triage vital signs and the nursing notes.  Pertinent labs & imaging results that were available during my care of the patient were reviewed by me and considered in my medical decision making (see chart for details).     Sending a swab for flu COVID and RSV because of patient's concern for her family as well as her patients.  Test results will be available this afternoon-Streptococcus negative Final Clinical Impressions(s) / UC Diagnoses   Final diagnoses:  Influenza-like illness     Discharge Instructions      Check my chart for test results I will call in any needed medication   ED Prescriptions   None    PDMP  not reviewed this encounter.   Eustace Moore,  MD 06/19/22 1057

## 2022-11-06 NOTE — Telephone Encounter (Signed)
na

## 2022-11-15 DIAGNOSIS — J069 Acute upper respiratory infection, unspecified: Secondary | ICD-10-CM | POA: Insufficient documentation

## 2022-11-15 HISTORY — DX: Acute upper respiratory infection, unspecified: J06.9

## 2023-01-31 DIAGNOSIS — E8889 Other specified metabolic disorders: Secondary | ICD-10-CM | POA: Diagnosis not present

## 2023-01-31 DIAGNOSIS — E559 Vitamin D deficiency, unspecified: Secondary | ICD-10-CM | POA: Diagnosis not present

## 2023-01-31 DIAGNOSIS — Z13220 Encounter for screening for lipoid disorders: Secondary | ICD-10-CM | POA: Diagnosis not present

## 2023-01-31 DIAGNOSIS — E6609 Other obesity due to excess calories: Secondary | ICD-10-CM | POA: Diagnosis not present

## 2023-01-31 DIAGNOSIS — Z1331 Encounter for screening for depression: Secondary | ICD-10-CM | POA: Diagnosis not present

## 2023-01-31 DIAGNOSIS — M109 Gout, unspecified: Secondary | ICD-10-CM | POA: Diagnosis not present

## 2023-01-31 DIAGNOSIS — E538 Deficiency of other specified B group vitamins: Secondary | ICD-10-CM | POA: Diagnosis not present

## 2023-01-31 DIAGNOSIS — Z6831 Body mass index (BMI) 31.0-31.9, adult: Secondary | ICD-10-CM | POA: Diagnosis not present

## 2023-01-31 DIAGNOSIS — R5383 Other fatigue: Secondary | ICD-10-CM | POA: Diagnosis not present

## 2023-01-31 DIAGNOSIS — Z131 Encounter for screening for diabetes mellitus: Secondary | ICD-10-CM | POA: Diagnosis not present

## 2023-02-08 DIAGNOSIS — Z6831 Body mass index (BMI) 31.0-31.9, adult: Secondary | ICD-10-CM | POA: Diagnosis not present

## 2023-02-08 DIAGNOSIS — Z01419 Encounter for gynecological examination (general) (routine) without abnormal findings: Secondary | ICD-10-CM | POA: Diagnosis not present

## 2023-02-11 ENCOUNTER — Encounter: Payer: Self-pay | Admitting: Emergency Medicine

## 2023-02-11 ENCOUNTER — Ambulatory Visit
Admission: EM | Admit: 2023-02-11 | Discharge: 2023-02-11 | Disposition: A | Discharge status: Home / Self Care | Payer: 59 | Attending: Physician Assistant | Admitting: Physician Assistant

## 2023-02-11 DIAGNOSIS — J020 Streptococcal pharyngitis: Secondary | ICD-10-CM | POA: Diagnosis not present

## 2023-02-11 DIAGNOSIS — Z1152 Encounter for screening for COVID-19: Secondary | ICD-10-CM | POA: Diagnosis not present

## 2023-02-11 DIAGNOSIS — J029 Acute pharyngitis, unspecified: Secondary | ICD-10-CM | POA: Diagnosis present

## 2023-02-11 LAB — POCT RAPID STREP A (OFFICE): Rapid Strep A Screen: NEGATIVE

## 2023-02-11 MED ORDER — AZITHROMYCIN 250 MG PO TABS: ORAL_TABLET | ORAL | 0 refills | Status: DC

## 2023-02-11 NOTE — ED Triage Notes (Signed)
Pt c/o sore throat and fever highest 100.6 onset Friday. Pt states she feels fatigued.

## 2023-02-11 NOTE — ED Provider Notes (Signed)
[facility]    CSN: [id number] Arrival date & time: 02/11/23  1036      History   Chief Complaint Chief Complaint  Patient presents with   Sore Throat    HPI Melissa Barnes is a 32 y.o. female.   Patient complains of sore throat that started several days ago.  She also complains of fever up to 100.6 at home.  She has been taking Tylenol and decongestants with minimal relief.  Reports mild congestion.  Denies sinus pain or pressure, cough, nausea, vomiting.    Past Medical History:  Diagnosis Date   Anemia    DDD (degenerative disc disease)    Unspecified vitamin D deficiency     Patient Active Problem List   Diagnosis Date Noted   Pregnancy 11/25/2021   Unspecified vitamin D deficiency 06/02/2013   DDD (degenerative disc disease) 06/02/2013   Other B-complex deficiencies 06/02/2013    Past Surgical History:  Procedure Laterality Date   CHOLECYSTECTOMY N/A 03/04/2018   Procedure: LAPAROSCOPIC CHOLECYSTECTOMY WITH INTRAOPERATIVE CHOLANGIOGRAM ERAS PATHWAY;  Surgeon: Griselda Miner, MD;  Location: [facility] OR;  Service: General;  Laterality: N/A;   CYST REMOVAL TRUNK Right 2009   chest wall   LUMBAR LAMINECTOMY/DECOMPRESSION MICRODISCECTOMY Right 11/27/2012   Procedure: LUMBAR LAMINECTOMY/DECOMPRESSION MICRODISCECTOMY;  Surgeon: Emilee Hero, MD;  Location: [facility] OR;  Service: Orthopedics;  Laterality: Right;  Right-sided lumbar 5-sacral 1 microdisectomy   WISDOM TOOTH EXTRACTION      OB History     Gravida  1   Para  1   Term  1   Preterm      AB      Living  1      SAB      IAB      Ectopic      Multiple  0   Live Births  1            Home Medications    Prior to Admission medications   Medication Sig Start Date End Date Taking? Authorizing Provider  azithromycin (ZITHROMAX Z-PAK) 250 MG tablet Take 2 tablets by mouth on day one and then 1 tablet by mouth. 02/11/23  Yes Ward, Tylene Fantasia, PA-C  cetirizine (ZYRTEC) 10 MG  tablet Take 10 mg by mouth daily.   Yes [provider]  cholecalciferol (VITAMIN D3) 25 MCG (1000 UNIT) tablet Take 1,000 Units by mouth daily.   Yes [provider]  JUNEL FE 24 1-20 MG-MCG(24) tablet Take 1 tablet by mouth daily. 06/02/22   [provider]  Prenatal Vit-Fe Fumarate-FA (PRENATAL PO) Take 1 tablet by mouth daily.    [provider]  norgestimate-ethinyl estradiol (ORTHO-CYCLEN,SPRINTEC,PREVIFEM) 0.25-35 MG-MCG tablet Take 1 tablet by mouth daily.  10/23/20  [provider]    Family History Family History  Problem Relation Age of Onset   Healthy Mother    Hypertension Father    Cancer Paternal Grandmother        ovarian    Social History Social History   Tobacco Use   Smoking status: Never   Smokeless tobacco: Never  Vaping Use   Vaping status: Never Used  Substance Use Topics   Alcohol use: Not Currently   Drug use: No     Allergies   Minocycline, Penicillins, Vicodin [hydrocodone-acetaminophen], and Codeine   Review of Systems Review of Systems  Constitutional:  Negative for chills and fever.  HENT:  Positive for congestion and sore throat. Negative for ear pain.  Eyes:  Negative for pain and visual disturbance.  Respiratory:  Negative for cough and shortness of breath.   Cardiovascular:  Negative for chest pain and palpitations.  Gastrointestinal:  Negative for abdominal pain and vomiting.  Genitourinary:  Negative for dysuria and hematuria.  Musculoskeletal:  Negative for arthralgias and back pain.  Skin:  Negative for color change and rash.  Neurological:  Negative for seizures and syncope.  All other systems reviewed and are negative.    Physical Exam Triage Vital Signs ED Triage Vitals  Encounter Vitals Group     BP 02/11/23 1100 103/72     Systolic BP Percentile --      Diastolic BP Percentile --      Pulse Rate 02/11/23 1100 92     Resp 02/11/23 1100 14     Temp 02/11/23 1100 98.1 F  (36.7 C)     Temp Source 02/11/23 1100 Oral     SpO2 02/11/23 1100 100 %     Weight --      Height --      Head Circumference --      Peak Flow --      Pain Score 02/11/23 1058 7     Pain Loc --      Pain Education --      Exclude from Growth Chart --    No data found.  Updated Vital Signs BP 103/72 (BP Location: Right Arm)   Pulse 92   Temp 98.1 F (36.7 C) (Oral)   Resp 14   LMP 01/17/2023   SpO2 100%   Visual Acuity Right Eye Distance:   Left Eye Distance:   Bilateral Distance:    Right Eye Near:   Left Eye Near:    Bilateral Near:     Physical Exam Vitals and nursing note reviewed.  Constitutional:      General: She is not in acute distress.    Appearance: She is well-developed.  HENT:     Head: Normocephalic and atraumatic.     Mouth/Throat:     Pharynx: Pharyngeal swelling and posterior oropharyngeal erythema present.  Eyes:     Conjunctiva/sclera: Conjunctivae normal.  Cardiovascular:     Rate and Rhythm: Normal rate and regular rhythm.     Heart sounds: No murmur heard. Pulmonary:     Effort: Pulmonary effort is normal. No respiratory distress.     Breath sounds: Normal breath sounds.  Abdominal:     Palpations: Abdomen is soft.     Tenderness: There is no abdominal tenderness.  Musculoskeletal:        General: No swelling.     Cervical back: Neck supple.  Skin:    General: Skin is warm and dry.     Capillary Refill: Capillary refill takes less than 2 seconds.  Neurological:     Mental Status: She is alert.  Psychiatric:        Mood and Affect: Mood normal.      UC Treatments / Results  Labs (all labs ordered are listed, but only abnormal results are displayed) Labs Reviewed  SARS CORONAVIRUS 2 (TAT 6-24 HRS)  POCT RAPID STREP A (OFFICE)    EKG   Radiology No results found.  Procedures Procedures (including critical care time)  Medications Ordered in UC Medications - No data to display  Initial Impression / Assessment and  Plan / UC Course  I have reviewed the triage vital signs and the nursing notes.  Pertinent labs & imaging results that  were available during my care of the patient were reviewed by me and considered in my medical decision making (see chart for details).     Will treat for strep given fever, sore throat, cervical lymphadenopathy, no cough.  COVID test pending.  Return precautions discussed.  Supportive care discussed. Final Clinical Impressions(s) / UC Diagnoses   Final diagnoses:  Acute streptococcal pharyngitis     Discharge Instructions      Take antibiotic as prescribed Continue with Tylenol or Ibuprofen as needed Recommend rest, drink plenty of fluids, can do salt water gargles Will call with test results.    ED Prescriptions     Medication Sig Dispense Auth. Provider   azithromycin (ZITHROMAX Z-PAK) 250 MG tablet Take 2 tablets by mouth on day one and then 1 tablet by mouth. 6 each Ward, Tylene Fantasia, PA-C      PDMP not reviewed this encounter.   Ward, Tylene Fantasia, PA-C 02/11/23 1132

## 2023-02-11 NOTE — Discharge Instructions (Signed)
Take antibiotic as prescribed Continue with Tylenol or Ibuprofen as needed Recommend rest, drink plenty of fluids, can do salt water gargles Will call with test results.

## 2023-02-14 DIAGNOSIS — E538 Deficiency of other specified B group vitamins: Secondary | ICD-10-CM | POA: Diagnosis not present

## 2023-02-14 DIAGNOSIS — M109 Gout, unspecified: Secondary | ICD-10-CM | POA: Diagnosis not present

## 2023-02-14 DIAGNOSIS — R5383 Other fatigue: Secondary | ICD-10-CM | POA: Diagnosis not present

## 2023-02-14 DIAGNOSIS — E6609 Other obesity due to excess calories: Secondary | ICD-10-CM | POA: Diagnosis not present

## 2023-02-14 DIAGNOSIS — E559 Vitamin D deficiency, unspecified: Secondary | ICD-10-CM | POA: Diagnosis not present

## 2023-02-14 DIAGNOSIS — Z6831 Body mass index (BMI) 31.0-31.9, adult: Secondary | ICD-10-CM | POA: Diagnosis not present

## 2023-03-14 DIAGNOSIS — M109 Gout, unspecified: Secondary | ICD-10-CM | POA: Diagnosis not present

## 2023-03-14 DIAGNOSIS — E6609 Other obesity due to excess calories: Secondary | ICD-10-CM | POA: Diagnosis not present

## 2023-03-14 DIAGNOSIS — E559 Vitamin D deficiency, unspecified: Secondary | ICD-10-CM | POA: Diagnosis not present

## 2023-03-14 DIAGNOSIS — Z6831 Body mass index (BMI) 31.0-31.9, adult: Secondary | ICD-10-CM | POA: Diagnosis not present

## 2023-03-14 DIAGNOSIS — R5383 Other fatigue: Secondary | ICD-10-CM | POA: Diagnosis not present

## 2023-03-14 DIAGNOSIS — E538 Deficiency of other specified B group vitamins: Secondary | ICD-10-CM | POA: Diagnosis not present

## 2023-04-04 NOTE — Progress Notes (Signed)
[facility] - Clinic Note  04/12/2023   CHIEF COMPLAINT Patient presents for Retina Evaluation  HISTORY OF PRESENT ILLNESS: Melissa Barnes is a 32 y.o. female who presents to the clinic today for:  HPI     Retina Evaluation   In both eyes.  This started 2 weeks ago.  Duration of 2 weeks.  Associated Symptoms Negative for Flashes, Floaters, Distortion, Blind Spot, Pain, Redness, Photophobia, Glare, Trauma, Scalp Tenderness, Jaw Claudication, Shoulder/Hip pain, Fever, Weight Loss and Fatigue.  Context:  distance vision, mid-range vision and near vision.  Treatments tried include no treatments.  I, the attending physician,  performed the HPI with the patient and updated documentation appropriately.        Comments   Patient states sudden onset of blurry vision OU. Eyes feel "super heavy" and "super tired". Difficulty with eye movements. Symptoms for the past two weeks. In addition, for the past week, patient reports frequent headaches. Headaches helped with ibuprofen and tylenol. Pain in sinuses around eyes and nose as well. Had CT scan at ER on 09.22.24 and results were normal. Saw optometrist on 09.14.24 and Dr. Conley Rolls mentioned hypertensive retinopathy. Is not on BP meds. BP is usually around 110 or less/60's to 80's.       Last edited by Rennis Chris, MD on 04/13/2023 12:51 AM.    Per patient had an episode of trouble focusing and blurry vision. She states her blood pressure is good. She has had a headache for a few days now. She feels when she turns her head the vision is lagging and depth perception is off.  Referring physician: Conley Rolls [employer] [place] [address] [place],  [place] [address]  HISTORICAL INFORMATION:  Selected notes from the MEDICAL RECORD NUMBER Referred by Dr. Aletta Edouard for concern of HTN ret OU LEE:  Ocular Hx- PMH-   CURRENT MEDICATIONS: No current outpatient medications on file. (Ophthalmic Drugs)   No current facility-administered  medications for this visit. (Ophthalmic Drugs)   Current Outpatient Medications (Other)  Medication Sig   cetirizine (ZYRTEC) 10 MG tablet Take 10 mg by mouth daily.   cholecalciferol (VITAMIN D3) 25 MCG (1000 UNIT) tablet Take 1,000 Units by mouth daily.   Prenatal Vit-Fe Fumarate-FA (PRENATAL PO) Take 1 tablet by mouth daily.   azithromycin (ZITHROMAX Z-PAK) 250 MG tablet Take 2 tablets by mouth on day one and then 1 tablet by mouth.   JUNEL FE 24 1-20 MG-MCG(24) tablet Take 1 tablet by mouth daily.   No current facility-administered medications for this visit. (Other)   REVIEW OF SYSTEMS: ROS   Positive for: Eyes Negative for: Constitutional, Gastrointestinal, Neurological, Skin, Genitourinary, Musculoskeletal, HENT, Endocrine, Cardiovascular, Respiratory, Psychiatric, Allergic/Imm, Heme/Lymph Last edited by Doreene Nest, COT on 04/12/2023 12:35 PM.     ALLERGIES Allergies  Allergen Reactions   Minocycline Hives   Penicillins Hives   Vicodin [Hydrocodone-Acetaminophen] Hives   Codeine Hives   PAST MEDICAL HISTORY Past Medical History:  Diagnosis Date   Anemia    DDD (degenerative disc disease)    Unspecified vitamin D deficiency    Past Surgical History:  Procedure Laterality Date   CHOLECYSTECTOMY N/A 03/04/2018   Procedure: LAPAROSCOPIC CHOLECYSTECTOMY WITH INTRAOPERATIVE CHOLANGIOGRAM ERAS PATHWAY;  Surgeon: Griselda Miner, MD;  Location: [facility] OR;  Service: General;  Laterality: N/A;   CYST REMOVAL TRUNK Right 2009   chest wall   LUMBAR LAMINECTOMY/DECOMPRESSION MICRODISCECTOMY Right 11/27/2012   Procedure: LUMBAR LAMINECTOMY/DECOMPRESSION MICRODISCECTOMY;  Surgeon: Emilee Hero, MD;  Location: [facility] [place] [facility] OR;  Service: Orthopedics;  Laterality: Right;  Right-sided lumbar 5-sacral 1 microdisectomy   WISDOM TOOTH EXTRACTION     FAMILY HISTORY Family History  Problem Relation Age of Onset   Healthy Mother    Hypertension Father    Cancer Paternal Grandmother         ovarian   SOCIAL HISTORY Social History   Tobacco Use   Smoking status: Never   Smokeless tobacco: Never  Vaping Use   Vaping status: Never Used  Substance Use Topics   Alcohol use: Not Currently   Drug use: No       OPHTHALMIC EXAM:  Base Eye Exam     Visual Acuity (Snellen - Linear)       Right Left   Dist [place] 20/20 20/20 -1         Tonometry (Tonopen, 12:50 PM)       Right Left   Pressure 12 10         Pupils       Dark Light Shape React APD   Right 4 3 Round Brisk None   Left 4 3 Round Brisk None         Visual Fields (Counting fingers)       Left Right    Full Full         Extraocular Movement       Right Left    Full, Ortho Full, Ortho         Neuro/Psych     Oriented x3: Yes   Mood/Affect: Normal           Slit Lamp and Fundus Exam     External Exam       Right Left   External Normal Normal         Slit Lamp Exam       Right Left   Lids/Lashes Normal Normal   Conjunctiva/Sclera White and quiet White and quiet   Cornea Clear Trace PEE inferiorly   Anterior Chamber Deep and clear Deep and quiet   Iris Round and dilated Round and dilated   Lens Clear Clear   Anterior Vitreous Normal Normal         Fundus Exam       Right Left   Disc Pink and sharp Pink and sharp   C/D Ratio 0.5 0.4   Macula Flat, Good foveal reflex, No heme or edema Flat, Good foveal reflex, No heme or edema   Vessels Normal Normal   Periphery Attached, No heme Attached, No heme           Refraction     Manifest Refraction       Sphere Cylinder Axis Dist VA   Right -0.25 +0.50 060 20/20   Left -0.25 Sphere  20/20           IMAGING AND PROCEDURES  Imaging and Procedures for 04/12/2023  OCT, Retina - OU - Both Eyes       Right Eye Quality was good. Central Foveal Thickness: 254. Progression has no prior data. Findings include normal foveal contour, no IRF, no SRF, vitreomacular adhesion .   Left Eye Quality was good.  Central Foveal Thickness: 253. Progression has no prior data. Findings include normal foveal contour, no IRF, no SRF, vitreomacular adhesion .   Notes *Images captured and stored on drive  Diagnosis / Impression:  NFP; no IRF/SRF OU  Clinical management:  See below  Abbreviations: NFP - Normal foveal profile. CME - cystoid macular edema. PED - pigment epithelial detachment. IRF - intraretinal fluid. SRF - subretinal fluid. EZ - ellipsoid zone. ERM - epiretinal membrane. ORA - outer retinal atrophy. ORT - outer retinal tubulation. SRHM - subretinal hyper-reflective material. IRHM - intraretinal hyper-reflective material           ASSESSMENT/PLAN:   ICD-10-CM   1. Visual disturbances  H53.9 OCT, Retina - OU - Both Eyes    2. Tension headache  G44.209      1,2. Visual disturbances, OU   - pt reports 1-2 wk history of blurred vision OU, worse with eye movements; associated frontal / tension headaches  - BCVA 20/20 OU and dilated exam without findings to explain visual symptoms - OCT without retinal edema OU - CT angio head/neck done  09.22.24 -- no abnormal findings - discussed exam, prognosis - no retinal or ophthalmic interventions indicated or recommended  - may benefit from Neurology consult for further work up of headache -- pt to discuss with PCP - f/u here PRN  Ophthalmic Meds Ordered this visit:  No orders of the defined types were placed in this encounter.    Return if symptoms worsen or fail to improve.  There are no Patient Instructions on file for this visit.  Explained the diagnoses, plan, and follow up with the patient and they expressed understanding.  Patient expressed understanding of the importance of proper follow up care.   This document serves as a record of services personally performed by Karie Chimera, MD, PhD. It was created on their behalf by Glee Arvin. Manson Passey, OA an ophthalmic technician. The creation of this record is the provider's dictation and/or  activities during the visit.    Electronically signed by: Glee Arvin. Manson Passey, OA 04/13/23 12:53 AM  This document serves as a record of services personally performed by Karie Chimera, MD, PhD. It was created on their behalf by Charlette Caffey, COT an ophthalmic technician. The creation of this record is the provider's dictation and/or activities during the visit.    Electronically signed by:  Charlette Caffey, COT  04/13/23 12:53 AM  Karie Chimera, M.D., Ph.D. Diseases & Surgery of the Retina and Vitreous [facility] 04/12/2023  I have reviewed the above documentation for accuracy and completeness, and I agree with the above. Karie Chimera, M.D., Ph.D. 04/13/23 1:00 AM   Abbreviations: M myopia (nearsighted); A astigmatism; H hyperopia (farsighted); P presbyopia; Mrx spectacle prescription;  CTL contact lenses; OD right eye; OS left eye; OU both eyes  XT exotropia; ET esotropia; PEK punctate epithelial keratitis; PEE punctate epithelial erosions; DES dry eye syndrome; MGD meibomian gland dysfunction; ATs artificial tears; PFAT's preservative free artificial tears; NSC nuclear sclerotic cataract; PSC posterior subcapsular cataract; ERM epi-retinal membrane; PVD posterior vitreous detachment; RD retinal detachment; DM diabetes mellitus; DR diabetic retinopathy; NPDR non-proliferative diabetic retinopathy; PDR proliferative diabetic retinopathy; CSME clinically significant macular edema; DME diabetic macular edema; dbh dot blot hemorrhages; CWS cotton wool spot; POAG primary open angle glaucoma; C/D cup-to-disc ratio; HVF humphrey visual field; GVF goldmann visual field; OCT optical coherence tomography; IOP intraocular pressure; BRVO Branch retinal vein occlusion; CRVO central retinal vein occlusion; CRAO central retinal artery occlusion; BRAO branch retinal artery occlusion; RT retinal tear; SB scleral buckle; PPV pars plana vitrectomy; VH Vitreous hemorrhage; PRP  panretinal laser photocoagulation; IVK intravitreal kenalog; VMT vitreomacular traction; MH Macular hole;  NVD neovascularization of the  disc; NVE neovascularization elsewhere; AREDS age related eye disease study; ARMD age related macular degeneration; POAG primary open angle glaucoma; EBMD epithelial/anterior basement membrane dystrophy; ACIOL anterior chamber intraocular lens; IOL intraocular lens; PCIOL posterior chamber intraocular lens; Phaco/IOL phacoemulsification with intraocular lens placement; PRK photorefractive keratectomy; LASIK laser assisted in situ keratomileusis; HTN hypertension; DM diabetes mellitus; COPD chronic obstructive pulmonary disease

## 2023-04-05 ENCOUNTER — Encounter (INDEPENDENT_AMBULATORY_CARE_PROVIDER_SITE_OTHER): Payer: Self-pay | Admitting: Ophthalmology

## 2023-04-08 ENCOUNTER — Encounter (HOSPITAL_BASED_OUTPATIENT_CLINIC_OR_DEPARTMENT_OTHER): Payer: Self-pay | Admitting: Emergency Medicine

## 2023-04-08 ENCOUNTER — Emergency Department (HOSPITAL_BASED_OUTPATIENT_CLINIC_OR_DEPARTMENT_OTHER)
Admission: EM | Admit: 2023-04-08 | Discharge: 2023-04-08 | Disposition: A | Discharge status: Home / Self Care | Payer: 59 | Attending: Emergency Medicine | Admitting: Emergency Medicine

## 2023-04-08 ENCOUNTER — Emergency Department (HOSPITAL_BASED_OUTPATIENT_CLINIC_OR_DEPARTMENT_OTHER): Payer: 59

## 2023-04-08 DIAGNOSIS — R519 Headache, unspecified: Secondary | ICD-10-CM

## 2023-04-08 DIAGNOSIS — R42 Dizziness and giddiness: Secondary | ICD-10-CM | POA: Diagnosis not present

## 2023-04-08 DIAGNOSIS — H538 Other visual disturbances: Secondary | ICD-10-CM

## 2023-04-08 DIAGNOSIS — H81391 Other peripheral vertigo, right ear: Secondary | ICD-10-CM

## 2023-04-08 DIAGNOSIS — R29818 Other symptoms and signs involving the nervous system: Secondary | ICD-10-CM | POA: Diagnosis not present

## 2023-04-08 LAB — BASIC METABOLIC PANEL
Anion gap: 11 (ref 5–15)
BUN: 15 mg/dL (ref 6–20)
CO2: 26 mmol/L (ref 22–32)
Calcium: 8.6 mg/dL — ABNORMAL LOW (ref 8.9–10.3)
Chloride: 97 mmol/L — ABNORMAL LOW (ref 98–111)
Creatinine, Ser: 0.54 mg/dL (ref 0.44–1.00)
GFR, Estimated: >60 mL/min (ref >60)
Glucose, Bld: 89 mg/dL (ref 70–99)
Potassium: 3.5 mmol/L (ref 3.5–5.1)
Sodium: 134 mmol/L — ABNORMAL LOW (ref 135–145)

## 2023-04-08 LAB — CBC WITH DIFFERENTIAL/PLATELET
Abs Immature Granulocytes: 0.02 10*3/uL (ref 0.00–0.07)
Basophils Absolute: 0 10*3/uL (ref 0.0–0.1)
Basophils Relative: 1 %
Eosinophils Absolute: 0 10*3/uL (ref 0.0–0.5)
Eosinophils Relative: 1 %
HCT: 36.8 % (ref 36.0–46.0)
Hemoglobin: 12.2 g/dL (ref 12.0–15.0)
Immature Granulocytes: 0 %
Lymphocytes Relative: 31 %
Lymphs Abs: 1.9 10*3/uL (ref 0.7–4.0)
MCH: 29.8 pg (ref 26.0–34.0)
MCHC: 33.2 g/dL (ref 30.0–36.0)
MCV: 90 fL (ref 80.0–100.0)
Monocytes Absolute: 0.3 10*3/uL (ref 0.1–1.0)
Monocytes Relative: 5 %
Neutro Abs: 3.8 10*3/uL (ref 1.7–7.7)
Neutrophils Relative %: 62 %
Platelets: 223 10*3/uL (ref 150–400)
RBC: 4.09 MIL/uL (ref 3.87–5.11)
RDW: 13 % (ref 11.5–15.5)
WBC: 6.1 10*3/uL (ref 4.0–10.5)
nRBC: 0 % (ref 0.0–0.2)

## 2023-04-08 LAB — PREGNANCY, URINE: Preg Test, Ur: NEGATIVE

## 2023-04-08 MED ORDER — DIPHENHYDRAMINE HCL 50 MG/ML IJ SOLN
12.5000 mg | Freq: Once | INTRAMUSCULAR | Status: AC
  Administered 2023-04-08: 12.5 mg via INTRAVENOUS
  Filled 2023-04-08: qty 1

## 2023-04-08 MED ORDER — IOHEXOL 350 MG/ML SOLN
75.0000 mL | Freq: Once | INTRAVENOUS | Status: AC | PRN
  Administered 2023-04-08: 75 mL via INTRAVENOUS

## 2023-04-08 MED ORDER — SODIUM CHLORIDE 0.9 % IV BOLUS
500.0000 mL | Freq: Once | INTRAVENOUS | Status: AC
Start: 2023-04-08 — End: 2023-04-08
  Administered 2023-04-08: 500 mL via INTRAVENOUS

## 2023-04-08 MED ORDER — KETOROLAC TROMETHAMINE 30 MG/ML IJ SOLN
30.0000 mg | Freq: Once | INTRAMUSCULAR | Status: AC
  Administered 2023-04-08: 30 mg via INTRAVENOUS
  Filled 2023-04-08: qty 1

## 2023-04-08 MED ORDER — IOHEXOL 300 MG/ML  SOLN: 75.0000 mL | Freq: Once | INTRAMUSCULAR | Status: DC | PRN

## 2023-04-08 MED ORDER — PROCHLORPERAZINE EDISYLATE 10 MG/2ML IJ SOLN
5.0000 mg | Freq: Once | INTRAMUSCULAR | Status: AC
  Administered 2023-04-08: 5 mg via INTRAVENOUS
  Filled 2023-04-08: qty 2

## 2023-04-08 NOTE — ED Provider Notes (Signed)
[facility] Provider Note   CSN: [id number] Arrival date & time: 04/08/23  1200     History  Chief Complaint  Patient presents with   Eye Problem    Melissa Barnes is a 32 y.o. female who presents emergency department with a chief complaint of of blurry vision.  Patient reports that she has had on and off unilateral aching headache for about 1-1/2 weeks.  She first noticed having blurry vision about a week and a half ago.  She was seen just a few days ago by an optometrist who noticed that she appeared to have some very mild upper tensive retinopathy was told that there was nothing to worry about.  She had a dilated eye exam, vision 20/25, normal eye pressures, no other issues.  She has a follow-up appointment with Dr. Vanessa Barbara this coming week.  Patient reports that she is also been feeling a little bit dizzy but mostly complains that in the peripheral vision people appear blurry.  She was at church today and this upset her so she came in for further evaluation.  She denies any other significant abnormality such as vision loss, nausea, vomiting, history of migraine headaches, phonophobia, photophobia.  She took Tylenol without relief of her symptoms.   Eye Problem      Home Medications Prior to Admission medications   Medication Sig Start Date End Date Taking? Authorizing Provider  azithromycin (ZITHROMAX Z-PAK) 250 MG tablet Take 2 tablets by mouth on day one and then 1 tablet by mouth. 02/11/23   Ward, Tylene Fantasia, PA-C  cetirizine (ZYRTEC) 10 MG tablet Take 10 mg by mouth daily.    [provider]  cholecalciferol (VITAMIN D3) 25 MCG (1000 UNIT) tablet Take 1,000 Units by mouth daily.    [provider]  JUNEL FE 24 1-20 MG-MCG(24) tablet Take 1 tablet by mouth daily. 06/02/22   [provider]  Prenatal Vit-Fe Fumarate-FA (PRENATAL PO) Take 1 tablet by mouth daily.    [provider]   norgestimate-ethinyl estradiol (ORTHO-CYCLEN,SPRINTEC,PREVIFEM) 0.25-35 MG-MCG tablet Take 1 tablet by mouth daily.  10/23/20  [provider]      Allergies    Minocycline, Penicillins, Vicodin [hydrocodone-acetaminophen], and Codeine    Review of Systems   Review of Systems  Physical Exam Updated Vital Signs BP 109/79   Pulse 73   Temp 98.1 F (36.7 C)   Resp 17   Ht 5\' 6"  (1.676 m)   Wt 83.5 kg   LMP 03/18/2023   SpO2 100%   BMI 29.70 kg/m  Physical Exam Vitals and nursing note reviewed.  Constitutional:      General: She is not in acute distress.    Appearance: She is well-developed. She is not diaphoretic.  HENT:     Head: Normocephalic and atraumatic.     Right Ear: External ear normal.     Left Ear: External ear normal.     Nose: Nose normal.     Mouth/Throat:     Mouth: Mucous membranes are moist.  Eyes:     General: Lids are normal. No scleral icterus.    Extraocular Movements:     Right eye: Nystagmus present.     Left eye: Nystagmus present.     Conjunctiva/sclera: Conjunctivae normal.     Pupils: Pupils are equal, round, and reactive to light.     Funduscopic exam:    Right eye: No hemorrhage, exudate or papilledema. Red reflex present.  Left eye: No hemorrhage, exudate or papilledema. Red reflex present.    Comments: Right sided nystagmus greater than 3 beats.  Diagnosis unilateral.  Movement induces mild dizziness  Cardiovascular:     Rate and Rhythm: Normal rate and regular rhythm.     Heart sounds: Normal heart sounds. No murmur heard.    No friction rub. No gallop.  Pulmonary:     Effort: Pulmonary effort is normal. No respiratory distress.     Breath sounds: Normal breath sounds.  Abdominal:     General: Bowel sounds are normal. There is no distension.     Palpations: Abdomen is soft. There is no mass.     Tenderness: There is no abdominal tenderness. There is no guarding.  Musculoskeletal:     Cervical back: Normal range of  motion.  Skin:    General: Skin is warm and dry.  Neurological:     Mental Status: She is alert and oriented to person, place, and time.     Comments: Speech is clear and goal oriented, follows commands Major Cranial nerves without deficit, no facial droop Normal strength in upper and lower extremities bilaterally including dorsiflexion and plantar flexion, strong and equal grip strength Sensation normal to light and sharp touch Moves extremities without ataxia, coordination intact Normal finger to nose and rapid alternating movements Neg romberg, no pronator drift Normal gait Normal heel-shin and balance   Psychiatric:        Behavior: Behavior normal.     ED Results / Procedures / Treatments   Labs (all labs ordered are listed, but only abnormal results are displayed) Labs Reviewed  BASIC METABOLIC PANEL - Abnormal; Notable for the following components:      Result Value   Sodium 134 (*)    Chloride 97 (*)    Calcium 8.6 (*)    All other components within normal limits  CBC WITH DIFFERENTIAL/PLATELET  PREGNANCY, URINE    EKG None  Radiology CT ANGIO HEAD NECK W WO CM  Result Date: 04/08/2023 CLINICAL DATA:  Headache, neuro deficit. Blurred vision. Intermittent headaches. EXAM: CT ANGIOGRAPHY HEAD AND NECK WITH AND WITHOUT CONTRAST TECHNIQUE: Multidetector CT imaging of the head and neck was performed using the standard protocol during bolus administration of intravenous contrast. Multiplanar CT image reconstructions and MIPs were obtained to evaluate the vascular anatomy. Carotid stenosis measurements (when applicable) are obtained utilizing NASCET criteria, using the distal internal carotid diameter as the denominator. RADIATION DOSE REDUCTION: This exam was performed according to the departmental dose-optimization program which includes automated exposure control, adjustment of the mA and/or kV according to patient size and/or use of iterative reconstruction technique.  CONTRAST:  75mL OMNIPAQUE IOHEXOL 350 MG/ML SOLN COMPARISON:  None Available. FINDINGS: CT HEAD FINDINGS Brain: No acute infarct, hemorrhage, or mass lesion is present. No significant white matter lesions are present. Deep brain nuclei are within normal limits. No significant extraaxial fluid collection is present. The ventricles are of normal size. The brainstem and cerebellum are within normal limits. Vascular: No hyperdense vessel or unexpected calcification. Skull: Calvarium is intact. No focal lytic or blastic lesions are present. No significant extracranial soft tissue lesion is present. Sinuses/Orbits: The paranasal sinuses and mastoid air cells are clear. The globes and orbits are within normal limits. Review of the MIP images confirms the above findings CTA NECK FINDINGS Aortic arch: A 3 vessel arch configuration is present. The aortic arch and great vessel origins are within normal limits. Right carotid system: The right common  carotid artery is within normal limits. Bifurcation is unremarkable. The cervical right ICA is normal. Left carotid system: The left common carotid artery is within normal limits. The bifurcation is unremarkable. The cervical left ICA is normal. Vertebral arteries: The vertebral arteries are codominant. Both vertebral arteries originate from the subclavian arteries without significant stenosis. No significant stenosis is present in either vertebral artery in the neck. Skeleton: The vertebral body heights and alignment are normal. No focal osseous lesions are present. Other neck: Soft tissues the neck are otherwise unremarkable. Salivary glands are within normal limits. Thyroid is normal. No significant adenopathy is present. No focal mucosal or submucosal lesions are present. Upper chest: The lung apices are clear. The thoracic inlet is within normal limits. Review of the MIP images confirms the above findings CTA HEAD FINDINGS Anterior circulation: The internal carotid arteries are  within normal limits from the high cervical segments through the ICA termini. The A1 and M1 segments are normal. The anterior communicating artery is patent. The MCA bifurcations are within normal limits. The ACA and MCA branch vessels are within normal limits bilaterally. Posterior circulation: The PICA origins are visualized and normal. The vertebrobasilar junction and basilar artery is normal. The superior cerebellar arteries are patent. The posterior cerebral arteries originate from basilar tip. The PCA branch vessels are normal bilaterally. Venous sinuses: The dural sinuses are patent. The straight sinus and deep cerebral veins are intact. Cortical veins are within normal limits. No significant vascular malformation is evident. Anatomic variants: None Review of the MIP images confirms the above findings IMPRESSION: 1. Normal noncontrast CT of the head. 2. Normal CTA of the neck. 3. Normal CTA Circle of Willis without significant proximal stenosis, aneurysm, or branch vessel occlusion. Electronically Signed   By: Marin Roberts M.D.   On: 04/08/2023 15:59    Procedures Procedures    Medications Ordered in ED Medications  sodium chloride 0.9 % bolus 500 mL (0 mLs Intravenous Stopped 04/08/23 1518)  prochlorperazine (COMPAZINE) injection 5 mg (5 mg Intravenous Given 04/08/23 1401)  diphenhydrAMINE (BENADRYL) injection 12.5 mg (12.5 mg Intravenous Given 04/08/23 1406)  ketorolac (TORADOL) 30 MG/ML injection 30 mg (30 mg Intravenous Given 04/08/23 1402)  iohexol (OMNIPAQUE) 350 MG/ML injection 75 mL (75 mLs Intravenous Contrast Given 04/08/23 1522)    ED Course/ Medical Decision Making/ A&P                                 Medical Decision Making This patient presents to the ED for concern of blurry vision, headache, mild dizziness, this involves an extensive number of treatment options, and is a complaint that carries with it a high risk of complications and morbidity.  Differential diagnosis  includes migraine headache, medication side effect, peripheral vertigo, central vertigo, vertebral artery dissection, brain mass.  Co morbidities:       has a past medical history of Anemia, DDD (degenerative disc disease), and Unspecified vitamin D deficiency.   Social Determinants of Health:       SDOH Screenings Tobacco Use: Low Risk  (04/08/2023) ]  Additional history:  {Additional history obtained from emr   Lab Tests:  I Ordered, and personally interpreted labs.  The pertinent results include:   None   Imaging Studies:  I ordered imaging studies including CTA H/N I independently visualized and interpreted imaging which showed no acute findings I agree with the radiologist interpretation   Medicines ordered and prescription  drug management:  I ordered medication including Medications sodium chloride 0.9 % bolus 500 mL (has no administration in time range) prochlorperazine (COMPAZINE) injection 5 mg (has no administration in time range) diphenhydrAMINE (BENADRYL) injection 12.5 mg (has no administration in time range) ketorolac (TORADOL) 30 MG/ML injection 30 mg (has no administration in time range) for headache Reevaluation of the patient after these medicines showed that the patient resolved, but still having " blurry" vision I have reviewed the patients home medicines and have made adjustments as needed  Test Considered:       MRI- however outside of her benign nystagmus she has no acute findings  Critical Interventions:         Consultations Obtained:   Problem List / ED Course:      Blurry vision, bilateral  (primary encounter diagnosis)  Nonintractable headache, unspecified chronicity pattern, unspecified headache type  Peripheral vertigo involving right ear    MDM: Suspect patient is having vertigo versus status migrainosus.  She is not on oral contraceptives as per ED chart as I discussed this with the patient at bedside.  She is only  taking a prenatal vitamin.  Patient has already been cleared by optometry and has follow-up with ophthalmology.  I suggest that if she continues to have symptoms she may need to follow-up with neurology or ENT.  Best place is probably to follow-up with her PCP.  Discussed outpatient follow-up and return precautions.   Dispostion:  After consideration of the diagnostic results and the patients response to treatment, I feel that the patent would benefit from discharge.    Amount and/or Complexity of Data Reviewed Labs: ordered. Radiology: ordered.  Risk Prescription drug management.          Final Clinical Impression(s) / ED Diagnoses Final diagnoses:  Blurry vision, bilateral  Nonintractable headache, unspecified chronicity pattern, unspecified headache type  Peripheral vertigo involving right ear    Rx / DC Orders ED Discharge Orders     None         Arthor Captain, PA-C 04/08/23 1633    Sloan Leiter, DO 04/12/23 [phone]

## 2023-04-08 NOTE — ED Triage Notes (Signed)
Pt reports blurry vision, unable to tell if it is one eye or the other or both x 1.5 wks; also c/o intermittent HA x 1 wk; she saw an optometrist last Saturday and was told she had "mild hypertensive retinopathy"

## 2023-04-08 NOTE — Discharge Instructions (Signed)
Get help right away if: You have: Severe eye pain. A severe headache. A sudden change in vision. A sudden loss of vision. A vision change after an injury. You notice flashing lights in your field of vision. Your field of vision is the area that you can see without moving your eyes.

## 2023-04-08 NOTE — ED Notes (Signed)
Patient transported to CT 

## 2023-04-08 NOTE — ED Notes (Signed)
Visual acuity completed per order. Pt does not wear glasses or contacts. Reports vision being more blurry in right eye.

## 2023-04-12 ENCOUNTER — Ambulatory Visit (INDEPENDENT_AMBULATORY_CARE_PROVIDER_SITE_OTHER): Payer: 59 | Admitting: Ophthalmology

## 2023-04-12 ENCOUNTER — Encounter (INDEPENDENT_AMBULATORY_CARE_PROVIDER_SITE_OTHER): Payer: Self-pay | Admitting: Ophthalmology

## 2023-04-12 DIAGNOSIS — H3581 Retinal edema: Secondary | ICD-10-CM

## 2023-04-12 DIAGNOSIS — G44209 Tension-type headache, unspecified, not intractable: Secondary | ICD-10-CM | POA: Diagnosis not present

## 2023-04-12 DIAGNOSIS — H539 Unspecified visual disturbance: Secondary | ICD-10-CM | POA: Diagnosis not present

## 2023-04-13 ENCOUNTER — Encounter (INDEPENDENT_AMBULATORY_CARE_PROVIDER_SITE_OTHER): Payer: Self-pay | Admitting: Ophthalmology

## 2023-05-17 IMAGING — DX DG TOE 2ND 2+V*L*
4 series · 4 of 4 positions shown · non-contrast
Comparison: None.

CLINICAL DATA: 30-year-old female with redness and swelling for 2
days with no known injury.

EXAM:
LEFT SECOND TOE

[toe ap]
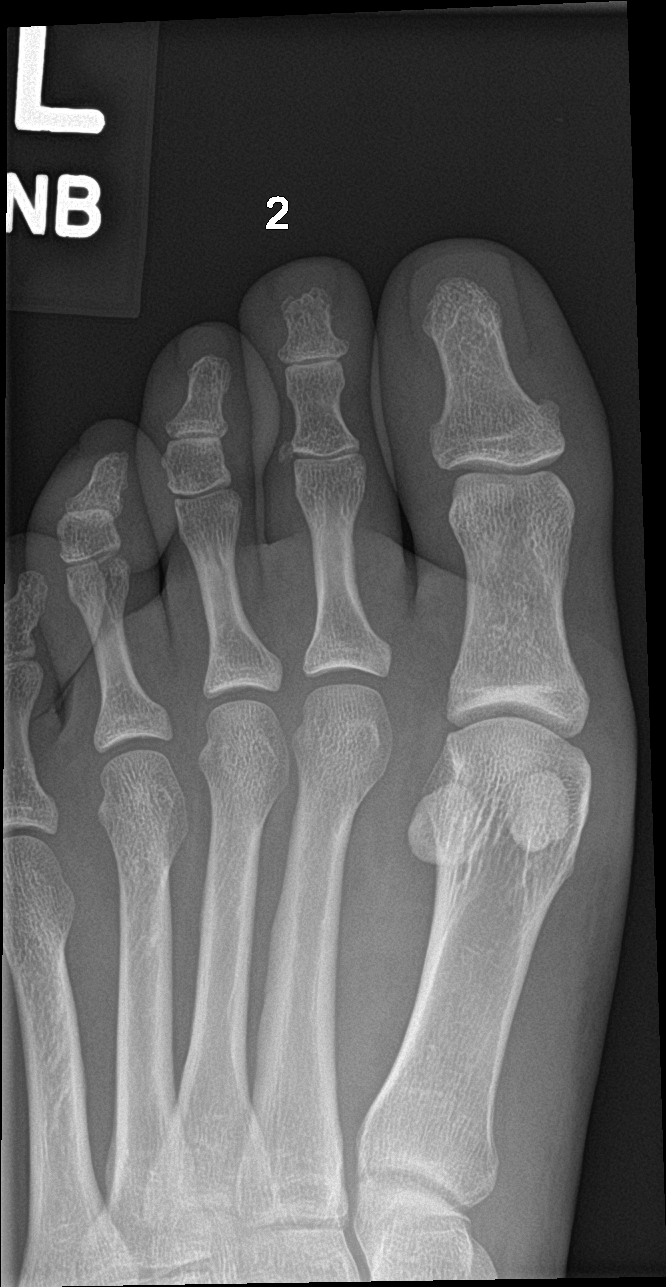

[toe obl]
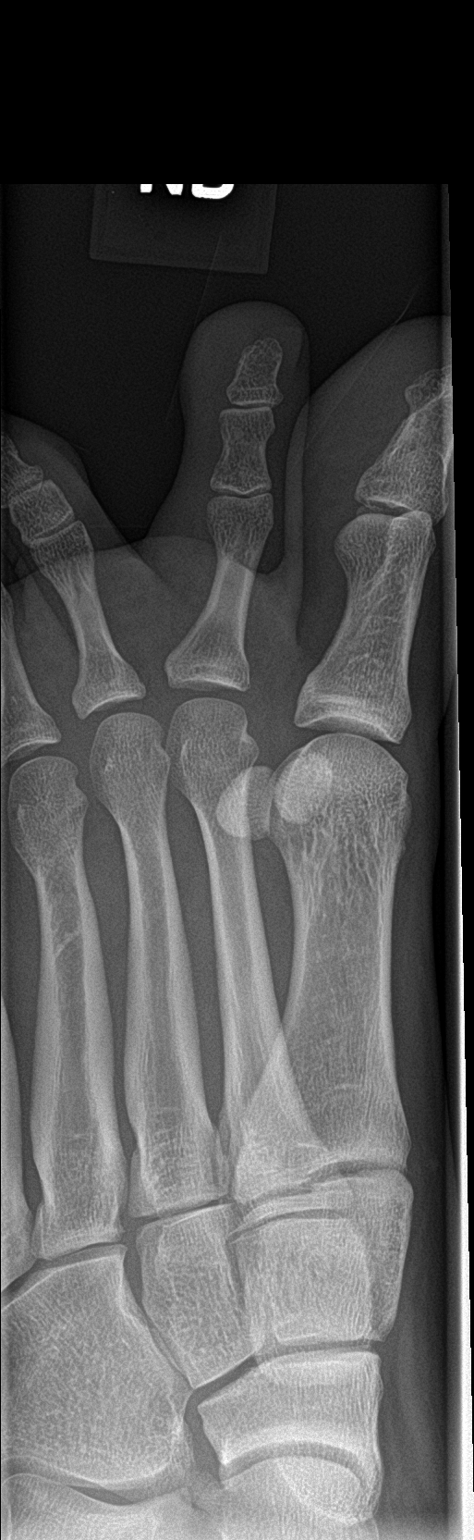

[toe lat (1 of 2)]
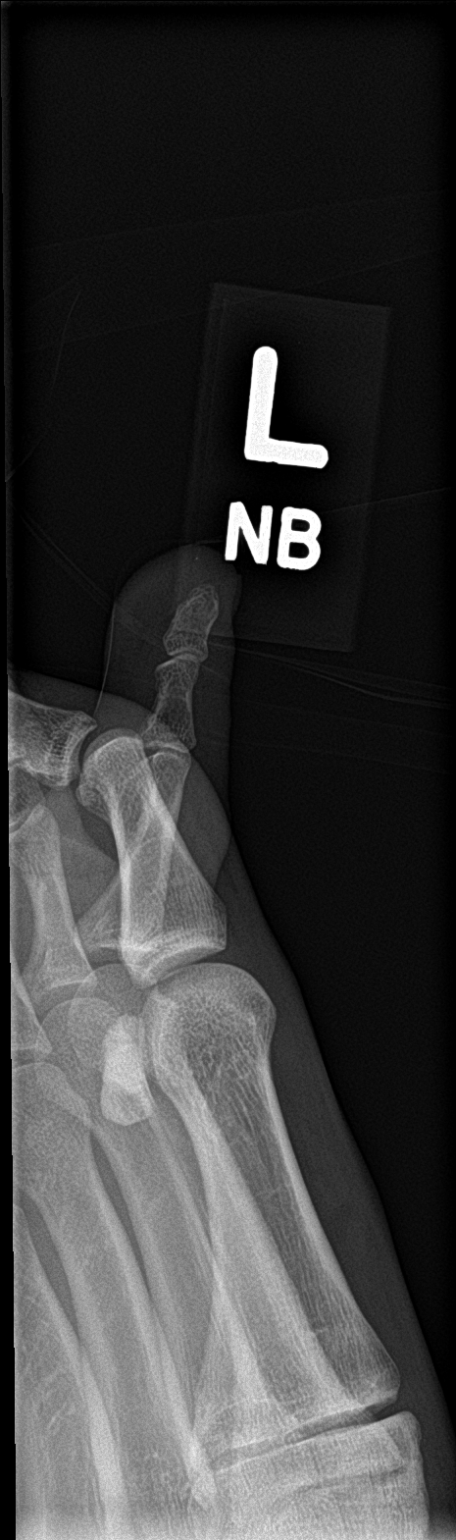

[toe lat (2 of 2)]
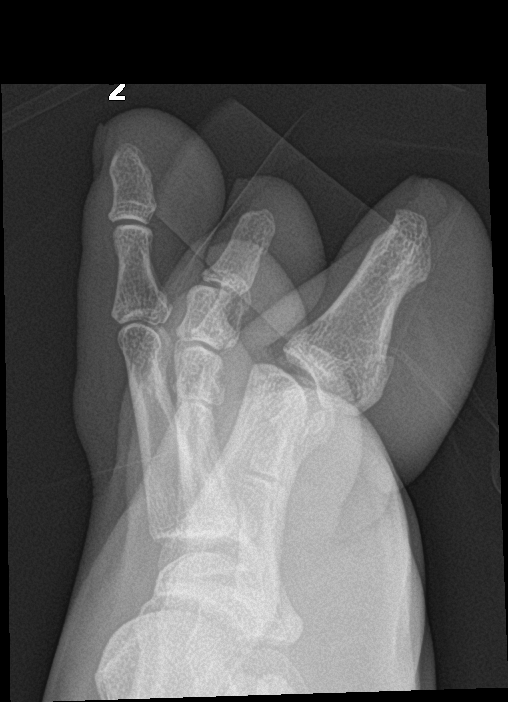

[4 of 4 positions shown; findings below may reference images not displayed]

FINDINGS: First through 3rd phalanges are visible on most of these images. The
2nd toe distal phalanx tuft demonstrates cortical irregularity
unlike the other distal phalanges. And furthermore there is
suspicion of early cortical osteolysis (images 1 and 4).

No soft tissue gas. No dislocation. Joint spaces and other phalanges
appear normal.
IMPRESSION: Cortical irregularity and suggestion of early osteolysis at the tuft
of the 2nd distal phalanx suspicious for Osteomyelitis. Query
leukocytosis. Sequelae of trauma is felt less likely.

## 2023-06-04 DIAGNOSIS — M109 Gout, unspecified: Secondary | ICD-10-CM | POA: Diagnosis not present

## 2023-06-04 DIAGNOSIS — Z6831 Body mass index (BMI) 31.0-31.9, adult: Secondary | ICD-10-CM | POA: Diagnosis not present

## 2023-06-04 DIAGNOSIS — E559 Vitamin D deficiency, unspecified: Secondary | ICD-10-CM | POA: Diagnosis not present

## 2023-06-04 DIAGNOSIS — E6609 Other obesity due to excess calories: Secondary | ICD-10-CM | POA: Diagnosis not present

## 2023-06-04 DIAGNOSIS — E538 Deficiency of other specified B group vitamins: Secondary | ICD-10-CM | POA: Diagnosis not present

## 2023-06-04 DIAGNOSIS — R5383 Other fatigue: Secondary | ICD-10-CM | POA: Diagnosis not present

## 2023-08-01 ENCOUNTER — Encounter: Payer: Self-pay | Admitting: Family Medicine

## 2023-08-01 ENCOUNTER — Ambulatory Visit (INDEPENDENT_AMBULATORY_CARE_PROVIDER_SITE_OTHER): Payer: 59 | Admitting: Family Medicine

## 2023-08-01 VITALS — BP 104/72 | HR 70 | Temp 97.8°F | Ht 66.0 in | Wt 186.0 lb

## 2023-08-01 DIAGNOSIS — E6609 Other obesity due to excess calories: Secondary | ICD-10-CM | POA: Diagnosis not present

## 2023-08-01 DIAGNOSIS — Z6830 Body mass index (BMI) 30.0-30.9, adult: Secondary | ICD-10-CM

## 2023-08-01 DIAGNOSIS — E66811 Obesity, class 1: Secondary | ICD-10-CM | POA: Diagnosis not present

## 2023-08-01 DIAGNOSIS — E538 Deficiency of other specified B group vitamins: Secondary | ICD-10-CM | POA: Diagnosis not present

## 2023-08-01 DIAGNOSIS — E559 Vitamin D deficiency, unspecified: Secondary | ICD-10-CM

## 2023-08-01 NOTE — Progress Notes (Deleted)
   Acute Office Visit  Subjective:     Patient ID: Melissa Barnes, female    DOB: 02-04-1991, 33 y.o.   MRN: [id number]  Chief Complaint  Patient presents with  . Establish Care    No concerns, used to see you and wanted to get reestablished    HPI Patient is in today for ***  ROS Per HPI      Objective:    BP 104/72 (BP Location: Left Arm, Patient Position: Sitting, Cuff Size: Normal)   Pulse 70   Temp 97.8 F (36.6 C) (Temporal)   Ht 5\' 6"  (1.676 m)   Wt 186 lb (84.4 kg)   SpO2 98%   Breastfeeding No   BMI 30.02 kg/m    Physical Exam Vitals and nursing note reviewed.  Constitutional:      Appearance: Normal appearance. She is normal weight.  HENT:     Head: Normocephalic and atraumatic.     Right Ear: Tympanic membrane and ear canal normal.     Left Ear: Tympanic membrane and ear canal normal.     Nose: Nose normal.  Eyes:     Extraocular Movements: Extraocular movements intact.     Pupils: Pupils are equal, round, and reactive to light.  Cardiovascular:     Rate and Rhythm: Normal rate and regular rhythm.     Heart sounds: Normal heart sounds.  Pulmonary:     Effort: Pulmonary effort is normal.     Breath sounds: Normal breath sounds.  Musculoskeletal:        General: Normal range of motion.     Cervical back: Normal range of motion.  Neurological:     General: No focal deficit present.     Mental Status: She is alert and oriented to person, place, and time.  Psychiatric:        Mood and Affect: Mood normal.        Thought Content: Thought content normal.    No results found for any visits on 08/01/23.      Assessment & Plan:  ***  No orders of the defined types were placed in this encounter.   No follow-ups on file.  Moshe Cipro, FNP

## 2023-08-03 ENCOUNTER — Encounter: Payer: Self-pay | Admitting: Family Medicine

## 2023-08-03 NOTE — Patient Instructions (Addendum)
Welcome to [facility]!  Follow-up with me for new or worsening symptoms.

## 2023-08-03 NOTE — Progress Notes (Signed)
New Patient Office Visit  Subjective    Patient ID: Melissa Barnes, female    DOB: 07-17-1991  Age: 33 y.o. MRN: [id number]  CC:  Chief Complaint  Patient presents with   Establish Care    No concerns, used to see you and wanted to get reestablished    HPI Melissa Barnes presents to establish care today. She is a previous patient of mine at [facility]. She sees Dr[facility] to help manage weight. Reports she has lost 20 pounds since seeing him.  She is very happy with him. She has significant history of vitamin B12 deficiency with neurologic symptoms, vitamin D deficiency. She is followed for gynecology by Dr. Renaldo Fiddler [facility]. She is up-to-date on routine vaccinations. She is up-to-date on routine screenings. Denies other concerns today. Medical history as outlined below.  Outpatient Encounter Medications as of 08/01/2023  Medication Sig   cetirizine (ZYRTEC) 10 MG tablet Take 10 mg by mouth daily.   cholecalciferol (VITAMIN D3) 25 MCG (1000 UNIT) tablet Take 1,000 Units by mouth daily.   Cyanocobalamin (VITAMIN B 12 PO) every other day.   Prenatal Vit-Fe Fumarate-FA (PRENATAL PO) Take 1 tablet by mouth daily.   [DISCONTINUED] azithromycin (ZITHROMAX Z-PAK) 250 MG tablet Take 2 tablets by mouth on day one and then 1 tablet by mouth.   [DISCONTINUED] JUNEL FE 24 1-20 MG-MCG(24) tablet Take 1 tablet by mouth daily.   [DISCONTINUED] norgestimate-ethinyl estradiol (ORTHO-CYCLEN,SPRINTEC,PREVIFEM) 0.25-35 MG-MCG tablet Take 1 tablet by mouth daily. (Patient not taking: Reported on 08/01/2023)   No facility-administered encounter medications on file as of 08/01/2023.    Past Medical History:  Diagnosis Date   Anemia    DDD (degenerative disc disease)    Unspecified vitamin D deficiency     Past Surgical History:  Procedure Laterality Date   CHOLECYSTECTOMY N/A 03/04/2018   Procedure: LAPAROSCOPIC CHOLECYSTECTOMY WITH  INTRAOPERATIVE CHOLANGIOGRAM ERAS PATHWAY;  Surgeon: Griselda Miner, MD;  Location: [facility] OR;  Service: General;  Laterality: N/A;   CYST REMOVAL TRUNK Right 2009   chest wall   LUMBAR LAMINECTOMY/DECOMPRESSION MICRODISCECTOMY Right 11/27/2012   Procedure: LUMBAR LAMINECTOMY/DECOMPRESSION MICRODISCECTOMY;  Surgeon: Emilee Hero, MD;  Location: [facility] OR;  Service: Orthopedics;  Laterality: Right;  Right-sided lumbar 5-sacral 1 microdisectomy   WISDOM TOOTH EXTRACTION      Family History  Problem Relation Age of Onset   Healthy Mother    Hypertension Father    Cancer Paternal Grandmother        ovarian    Social History   Socioeconomic History   Marital status: Single    Spouse name: Not on file   Number of children: Not on file   Years of education: Not on file   Highest education level: Not on file  Occupational History   Not on file  Tobacco Use   Smoking status: Never   Smokeless tobacco: Never  Vaping Use   Vaping status: Never Used  Substance and Sexual Activity   Alcohol use: Not Currently   Drug use: No   Sexual activity: Yes    Birth control/protection: Pill  Other Topics Concern   Not on file  Social History Narrative   Not on file   Social Drivers of Health   Financial Resource Strain: Not on file  Food Insecurity: Not on file  Transportation Needs: Not on file  Physical Activity: Not on file  Stress: Not on file  Social  Connections: Not on file  Intimate Partner Violence: Not on file    ROS Per HPI      Objective    BP 104/72 (BP Location: Left Arm, Patient Position: Sitting, Cuff Size: Normal)   Pulse 70   Temp 97.8 F (36.6 C) (Temporal)   Ht 5\' 6"  (1.676 m)   Wt 186 lb (84.4 kg)   SpO2 98%   Breastfeeding No   BMI 30.02 kg/m   Physical Exam Vitals and nursing note reviewed.  Constitutional:      General: She is not in acute distress.    Appearance: Normal appearance. She is normal weight.  HENT:     Head: Normocephalic and  atraumatic.  Eyes:     Extraocular Movements: Extraocular movements intact.     Pupils: Pupils are equal, round, and reactive to light.  Cardiovascular:     Rate and Rhythm: Normal rate and regular rhythm.     Heart sounds: Normal heart sounds.  Pulmonary:     Effort: Pulmonary effort is normal. No respiratory distress.     Breath sounds: Normal breath sounds. No wheezing, rhonchi or rales.  Musculoskeletal:        General: Normal range of motion.     Cervical back: Normal range of motion.  Lymphadenopathy:     Cervical: No cervical adenopathy.  Skin:    General: Skin is warm and dry.     Capillary Refill: Capillary refill takes less than 2 seconds.  Neurological:     General: No focal deficit present.     Mental Status: She is alert and oriented to person, place, and time.  Psychiatric:        Mood and Affect: Mood normal.        Thought Content: Thought content normal.         Assessment & Plan:   Class 1 obesity due to excess calories without serious comorbidity with body mass index (BMI) of 30.0 to 30.9 in adult  - Discussed healthy diet and activity level - Follow up with Dr Kerry Hough as scheduled  Vitamin D deficiency  - Continue vit D supplementation  Vitamin B 12 deficiency  - Continue vit B12 supplementation  Return if symptoms worsen or fail to improve.   Moshe Cipro, FNP

## 2023-08-27 ENCOUNTER — Encounter: Payer: Self-pay | Admitting: Family Medicine

## 2023-08-27 ENCOUNTER — Ambulatory Visit: Payer: 59 | Admitting: Family Medicine

## 2023-08-27 VITALS — BP 102/72 | HR 75 | Temp 97.8°F | Ht 66.0 in | Wt 183.0 lb

## 2023-08-27 DIAGNOSIS — B349 Viral infection, unspecified: Secondary | ICD-10-CM

## 2023-08-27 DIAGNOSIS — R051 Acute cough: Secondary | ICD-10-CM | POA: Diagnosis not present

## 2023-08-27 DIAGNOSIS — H6593 Unspecified nonsuppurative otitis media, bilateral: Secondary | ICD-10-CM | POA: Diagnosis not present

## 2023-08-27 LAB — POCT INFLUENZA A/B
Influenza A, POC: NEGATIVE
Influenza B, POC: NEGATIVE

## 2023-08-27 LAB — POC COVID19 BINAXNOW: SARS Coronavirus 2 Ag: NEGATIVE

## 2023-08-27 MED ORDER — PROMETHAZINE HCL 6.25 MG/5ML PO SOLN
6.2500 mg | Freq: Four times a day (QID) | ORAL | 0 refills | Status: AC | PRN
Start: 2023-08-27 — End: ?

## 2023-08-27 MED ORDER — CEFDINIR 300 MG PO CAPS: 300.0000 mg | ORAL_CAPSULE | Freq: Two times a day (BID) | ORAL | 0 refills | Status: AC

## 2023-08-27 NOTE — Progress Notes (Signed)
 Acute Office Visit  Subjective:     Patient ID: Melissa Barnes, female    DOB: [birth year], 33 y.o.   MRN: [id number]  Chief Complaint  Patient presents with   Cough    Started on Wednesday, got back from [place] last night   Generalized Body Aches   Fever   Nasal Congestion   Ear Pain    Cough Associated symptoms include a fever.  Fever  Associated symptoms include coughing.   Patient is in today for evaluation of cough, body aches, fever, congestion and bilateral ear pain, for the last 5 days. Has tried OTC cough and cold, ibuprofen  and Tylenol . Reports that she just got back from [place] last night. Denies known sick contacts. Denies abdominal pain, nausea, vomiting, diarrhea, rash, other symptoms.  Medical hx as outlined below.  Review of Systems  Constitutional:  Positive for fever.  Respiratory:  Positive for cough.    Per HPI      Objective:    BP 102/72 (BP Location: Left Arm, Patient Position: Sitting)   Pulse 75   Temp 97.8 F (36.6 C) (Temporal)   Ht 5\' 6"  (1.676 m)   Wt 183 lb (83 kg)   SpO2 99%   BMI 29.54 kg/m    Physical Exam Vitals and nursing note reviewed.  Constitutional:      General: She is not in acute distress.    Appearance: She is ill-appearing.  HENT:     Head: Normocephalic and atraumatic.     Right Ear: A middle ear effusion is present. Tympanic membrane is erythematous and bulging.     Left Ear: A middle ear effusion is present. Tympanic membrane is erythematous and bulging.     Nose: No congestion.     Mouth/Throat:     Mouth: Mucous membranes are moist.     Pharynx: Oropharynx is clear. No oropharyngeal exudate or posterior oropharyngeal erythema.  Eyes:     Extraocular Movements: Extraocular movements intact.  Cardiovascular:     Rate and Rhythm: Normal rate and regular rhythm.  Pulmonary:     Effort: Pulmonary effort is normal. No respiratory distress.     Breath sounds: No decreased breath sounds, wheezing,  rhonchi or rales.     Comments: Persistent cough Musculoskeletal:     Cervical back: Normal range of motion and neck supple.  Lymphadenopathy:     Cervical: Cervical adenopathy present.  Skin:    General: Skin is warm and dry.  Neurological:     General: No focal deficit present.     Mental Status: She is alert and oriented to person, place, and time.     Results for orders placed or performed in visit on 08/27/23  POCT Influenza A/B  Result Value Ref Range   Influenza A, POC Negative Negative   Influenza B, POC Negative Negative  POC COVID-19 BinaxNow  Result Value Ref Range   SARS Coronavirus 2 Ag Negative Negative        Assessment & Plan:  1. Bilateral otitis media with effusion (Primary)  - cefdinir  (OMNICEF ) 300 MG capsule; Take 1 capsule (300 mg total) by mouth 2 (two) times daily for 10 days.  Dispense: 20 capsule; Refill: 0  2. Viral illness  - POCT Influenza A/B - POC COVID-19 BinaxNow - COVID and flu are negative  3. Acute cough  - promethazine  (PHENERGAN ) 6.25 MG/5ML solution; Take 5 mLs (6.25 mg total) by mouth every 6 (six) hours as needed for nausea or vomiting.  Dispense: 120 mL; Refill: 0   Meds ordered this encounter  Medications   cefdinir  (OMNICEF ) 300 MG capsule    Sig: Take 1 capsule (300 mg total) by mouth 2 (two) times daily for 10 days.    Dispense:  20 capsule    Refill:  0   promethazine  (PHENERGAN ) 6.25 MG/5ML solution    Sig: Take 5 mLs (6.25 mg total) by mouth every 6 (six) hours as needed for nausea or vomiting.    Dispense:  120 mL    Refill:  0    Return if symptoms worsen or fail to improve.  Casimer Clear, FNP

## 2023-08-27 NOTE — Patient Instructions (Addendum)
 I have sent in Cefdinir  for you to take twice a day for 10 days. Take this medication with food.  I have sent in hydrocodone cough syrup for you to take 5 mL once daily in the evening as needed for cough.  This medication may make you sleepy.  Do not drive or operate heavy machinery while taking this medication.  COVID and flu testing are negative.  Continue supportive care at home, make sure you are drinking plenty of fluids and getting some rest.  May take ibuprofen  and Tylenol  as needed for fever, aches and pains.  May continue OTC cough and cold medications as needed with benefit.   Follow-up with me if symptoms are persisting over the next week.

## 2023-09-17 ENCOUNTER — Encounter: Payer: Self-pay | Admitting: Family Medicine

## 2023-09-17 ENCOUNTER — Ambulatory Visit: Admitting: Family Medicine

## 2023-09-17 VITALS — BP 108/70 | HR 71 | Temp 98.7°F | Ht 66.0 in | Wt 182.4 lb

## 2023-09-17 DIAGNOSIS — B9689 Other specified bacterial agents as the cause of diseases classified elsewhere: Secondary | ICD-10-CM | POA: Diagnosis not present

## 2023-09-17 DIAGNOSIS — J019 Acute sinusitis, unspecified: Secondary | ICD-10-CM

## 2023-09-17 MED ORDER — CEFPODOXIME PROXETIL 200 MG PO TABS: 200.0000 mg | ORAL_TABLET | Freq: Two times a day (BID) | ORAL | 0 refills | Status: AC

## 2023-09-17 NOTE — Progress Notes (Signed)
   Acute Office Visit  Subjective:     Patient ID: Melissa Barnes, female    DOB: 22-Nov-1990, 33 y.o.   MRN: [id number]  Chief Complaint  Patient presents with   Ear Pain    Ear continuing to ache since 02/10, throat is sore and started yesterday   Cough    Ongoing since February visit, coughing up green mucus    HPI Patient is in today for evaluation of HA, L ear pain and fullness, productive cough for the last few weeks.  Was treated for an ear infection with cefdinir on 08/27/23. Symptoms improved, then returned once antibiotic was completed. Denies known sick contacts. Denies abdominal pain, nausea, vomiting, diarrhea, rash, fever, chills, other symptoms. Medical history as outlined below.  ROS Per HPI      Objective:    BP 108/70 (BP Location: Left Arm, Patient Position: Sitting)   Pulse 71   Temp 98.7 F (37.1 C) (Temporal)   Ht 5\' 6"  (1.676 m)   Wt 182 lb 6.4 oz (82.7 kg)   SpO2 96%   BMI 29.44 kg/m    Physical Exam Vitals and nursing note reviewed.  Constitutional:      General: She is not in acute distress. HENT:     Head: Normocephalic and atraumatic.     Right Ear: Ear canal normal. A middle ear effusion is present. Tympanic membrane is not erythematous or bulging.     Left Ear: Ear canal normal. A middle ear effusion is present. Tympanic membrane is not erythematous or bulging.     Nose: No congestion.     Right Sinus: Frontal sinus tenderness present.     Left Sinus: Frontal sinus tenderness present.     Mouth/Throat:     Mouth: Mucous membranes are moist.     Pharynx: Oropharynx is clear. No oropharyngeal exudate or posterior oropharyngeal erythema.     Comments: Oropharyngeal cobblestoning   Eyes:     Extraocular Movements: Extraocular movements intact.  Cardiovascular:     Rate and Rhythm: Normal rate and regular rhythm.     Heart sounds: Normal heart sounds.  Pulmonary:     Effort: Pulmonary effort is normal. No respiratory distress.      Breath sounds: No wheezing, rhonchi or rales.  Musculoskeletal:     Cervical back: Normal range of motion and neck supple.  Lymphadenopathy:     Cervical: Cervical adenopathy (L>R) present.  Skin:    General: Skin is warm and dry.  Neurological:     Mental Status: She is alert.     No results found for any visits on 09/17/23.      Assessment & Plan:  1. Acute bacterial sinusitis (Primary)  - cefpodoxime (VANTIN) 200 MG tablet; Take 1 tablet (200 mg total) by mouth 2 (two) times daily for 7 days.  Dispense: 14 tablet; Refill: 0   Meds ordered this encounter  Medications   cefpodoxime (VANTIN) 200 MG tablet    Sig: Take 1 tablet (200 mg total) by mouth 2 (two) times daily for 7 days.    Dispense:  14 tablet    Refill:  0    Return if symptoms worsen or fail to improve.  Moshe Cipro, FNP

## 2023-09-17 NOTE — Patient Instructions (Signed)
 I have sent in an antibiotic for you to take 1 tablet by mouth twice a day for 7 days.   Please eat with this medication, it can upset your stomach if you do not.    Take all of the medication even if you are feeling better.   Follow-up with me for new or worsening symptoms.

## 2023-09-18 ENCOUNTER — Telehealth: Payer: Self-pay

## 2023-09-18 ENCOUNTER — Other Ambulatory Visit (HOSPITAL_COMMUNITY): Payer: Self-pay

## 2023-09-18 NOTE — Telephone Encounter (Signed)
 Pharmacy Patient Advocate Encounter   Received notification from Patient Pharmacy that prior authorization for Cefpodoxime 200mg  is required/requested.   Insurance verification completed.   The patient is insured through [facility] .   Per test claim: Product/service not covered - Plan/Benefit exclusion. Drug excluded from coverage. Consider generic alternatives Cefdinir, Cefprozil or Amoxicillin-Clavulanate 500/125 or 875/125. I noticed in the chart the patient has a Penicillin allergy.

## 2023-09-19 NOTE — Telephone Encounter (Signed)
 Spoke with patient, she has already picked up the medication

## 2023-09-21 ENCOUNTER — Other Ambulatory Visit (HOSPITAL_COMMUNITY): Payer: Self-pay

## 2024-03-30 ENCOUNTER — Telehealth: Admitting: Family Medicine

## 2024-03-30 DIAGNOSIS — J039 Acute tonsillitis, unspecified: Secondary | ICD-10-CM | POA: Diagnosis not present

## 2024-03-31 MED ORDER — AZITHROMYCIN 250 MG PO TABS
ORAL_TABLET | ORAL | 0 refills | Status: AC
Start: 2024-03-31 — End: 2024-04-05

## 2024-03-31 NOTE — Progress Notes (Signed)
 E-Visit for Sore Throat - Strep Symptoms  We are sorry that you are not feeling well.  Here is how we plan to help!  Based on what you have shared with me it is likely that you have strep pharyngitis.  Strep pharyngitis is inflammation and infection in the back of the throat.  This is an infection cause by bacteria and is treated with antibiotics.  I have prescribed Azithromycin 250 mg two tablets today and then one daily for 4 additional days. For throat pain, we recommend over the counter oral pain relief medications such as acetaminophen or aspirin, or anti-inflammatory medications such as ibuprofen or naproxen sodium. Topical treatments such as oral throat lozenges or sprays may be used as needed. Strep infections are not as easily transmitted as other respiratory infections, however we still recommend that you avoid close contact with loved ones, especially the very young and elderly.  Remember to wash your hands thoroughly throughout the day as this is the number one way to prevent the spread of infection and wipe down door knobs and counters with disinfectant.   Home Care: Only take medications as instructed by your medical team. Complete the entire course of an antibiotic. Do not take these medications with alcohol. A steam or ultrasonic humidifier can help congestion.  You can place a towel over your head and breathe in the steam from hot water coming from a faucet. Avoid close contacts especially the very young and the elderly. Cover your mouth when you cough or sneeze. Always remember to wash your hands.  Get Help Right Away If: You develop worsening fever or sinus pain. You develop a severe head ache or visual changes. Your symptoms persist after you have completed your treatment plan.  Make sure you Understand these instructions. Will watch your condition. Will get help right away if you are not doing well or get worse.   Thank you for choosing an e-visit.  Your e-visit  answers were reviewed by a board certified advanced clinical practitioner to complete your personal care plan. Depending upon the condition, your plan could have included both over the counter or prescription medications.  Please review your pharmacy choice. Make sure the pharmacy is open so you can pick up prescription now. If there is a problem, you may contact your provider through [employer] and have the prescription routed to another pharmacy.  Your safety is important to [place]. If you have drug allergies check your prescription carefully.   For the next 24 hours you can use MyChart to ask questions about today's visit, request a non-urgent call back, or ask for a work or school excuse. You will get an email in the next two days asking about your experience. I hope that your e-visit has been valuable and will speed your recovery.  I have spent 5 minutes in review of e-visit questionnaire, review and updating patient chart, medical decision making and response to patient.   Margaretann Loveless, PA-C
